# Patient Record
Sex: Male | Born: 1970
Health system: Southern US, Community
[De-identification: ages and names within clinical notes are randomized; demographics above are authoritative.]

## PROBLEM LIST (undated history)

## (undated) ENCOUNTER — Emergency Department (HOSPITAL_BASED_OUTPATIENT_CLINIC_OR_DEPARTMENT_OTHER)

---

## 2012-11-22 ENCOUNTER — Other Ambulatory Visit: Payer: Self-pay | Admitting: Gastroenterology

## 2012-11-24 ENCOUNTER — Ambulatory Visit
Admission: RE | Admit: 2012-11-24 | Discharge: 2012-11-24 | Disposition: A | Discharge status: Home / Self Care | Payer: 59 | Source: Ambulatory Visit | Attending: Gastroenterology | Admitting: Gastroenterology

## 2014-06-29 IMAGING — US US ABDOMEN COMPLETE
1 series · 14 of 25 positions shown · non-contrast
Comparison: None.

CLINICAL DATA: Elevated liver function tests.

COMPLETE ABDOMINAL ULTRASOUND

[Series 1: us abdomen complete · 0.24mm/px · 14 of 75 slices shown]
[im 1/75]
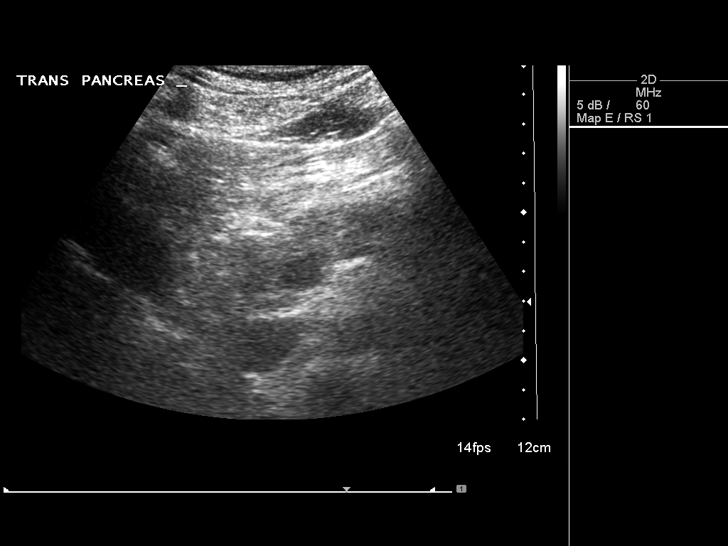
[im 7/75]
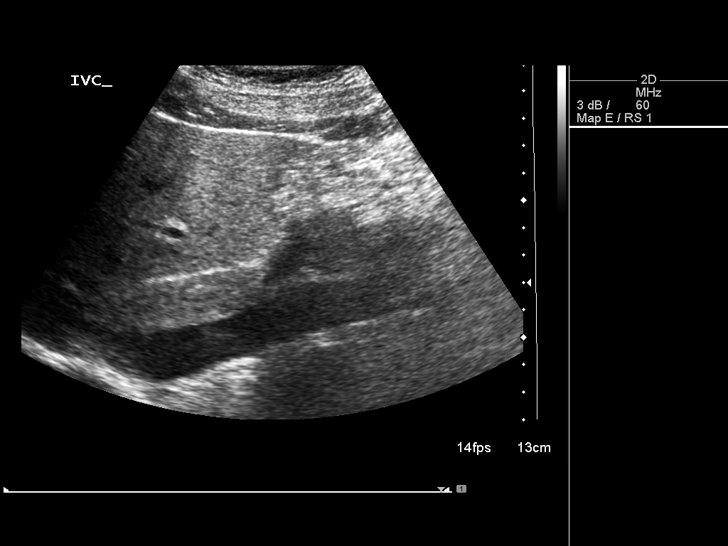
[im 13/75]
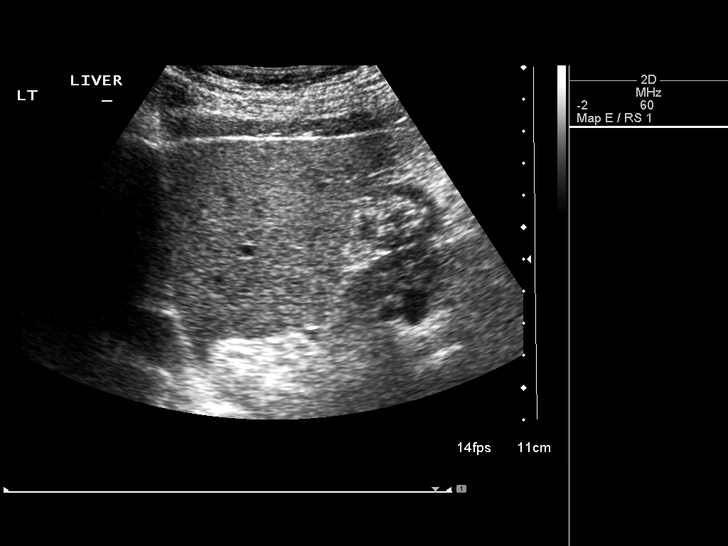
[im 19/75]
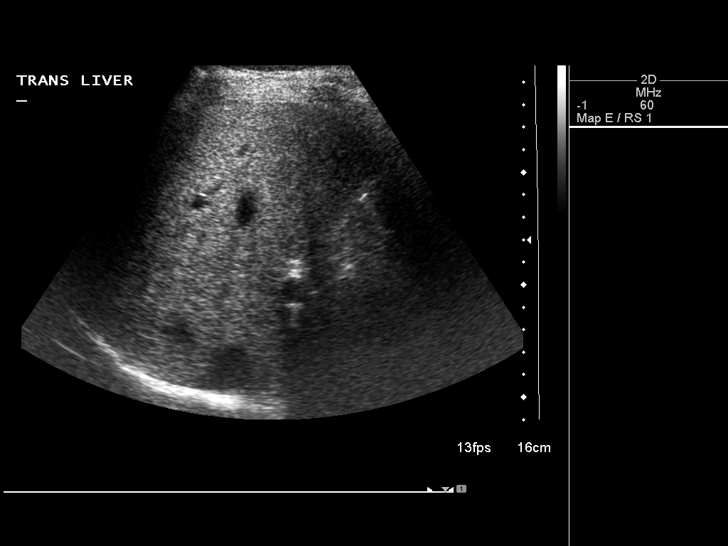
[im 25/75]
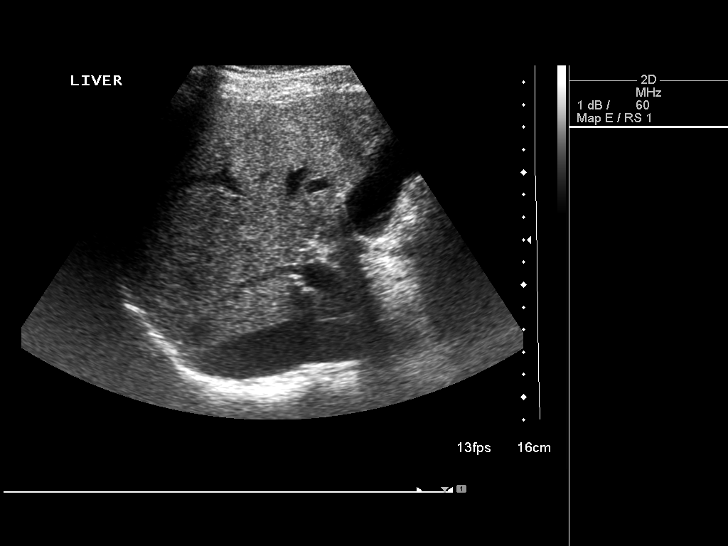
[im 28/75]
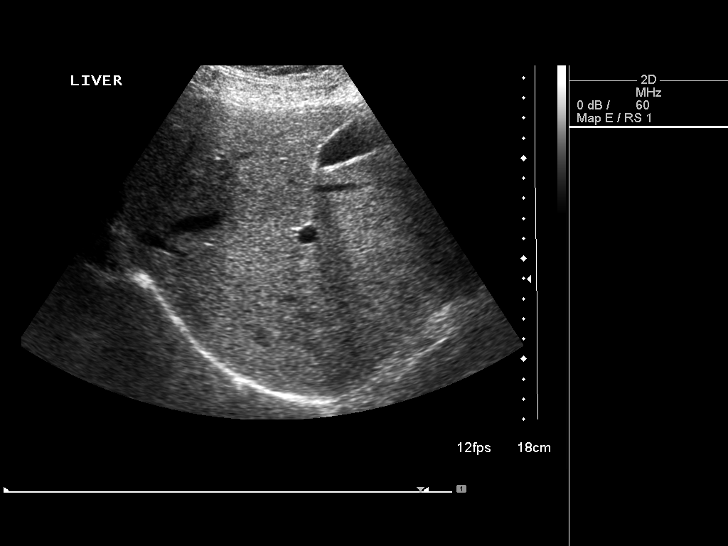
[im 34/75]
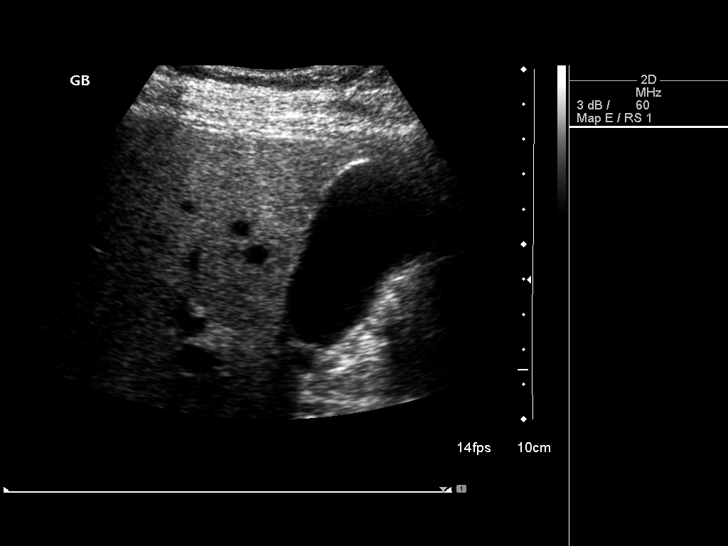
[im 41/75]
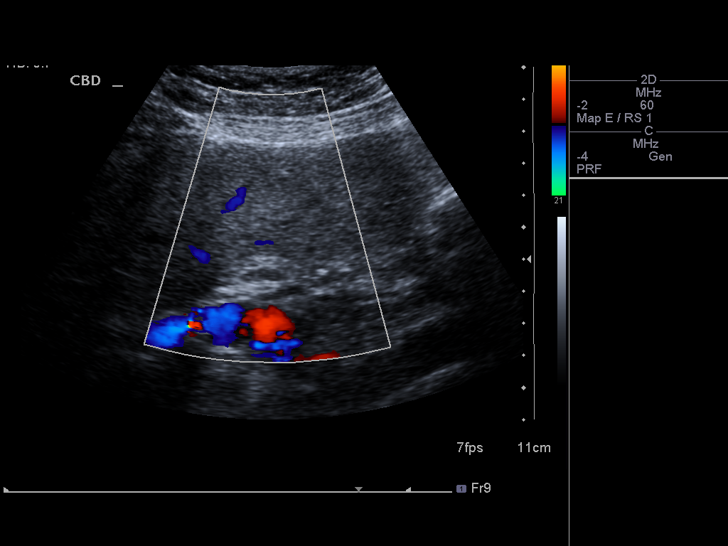
[im 47/75]
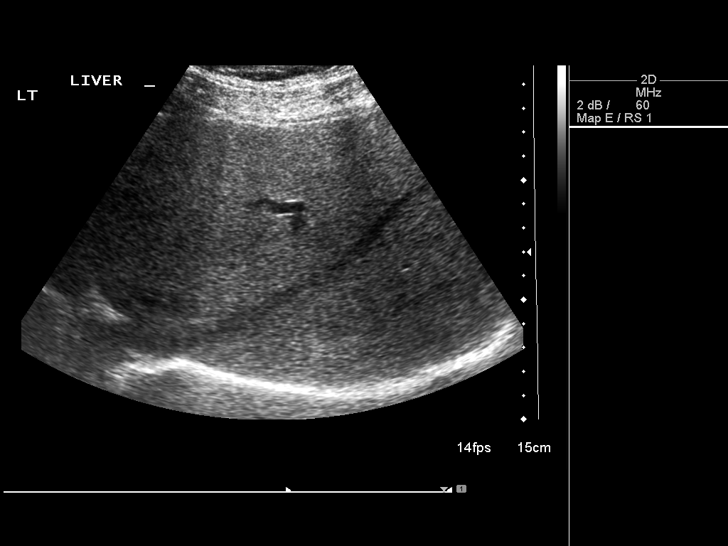
[im 50/75]
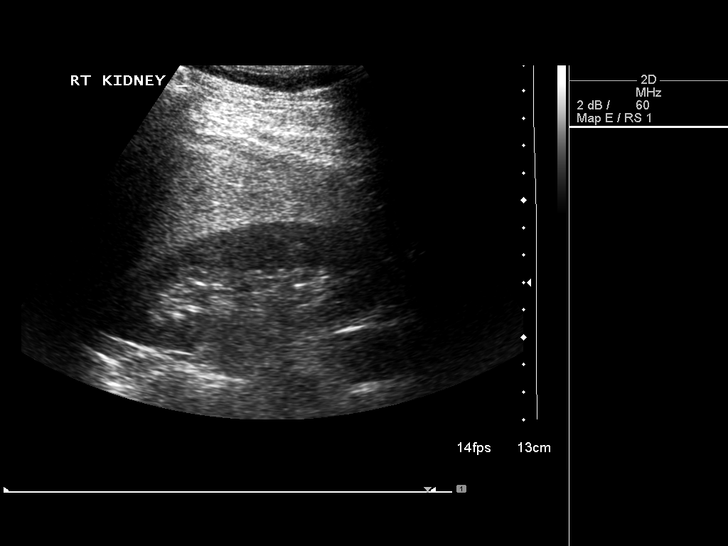
[im 56/75]
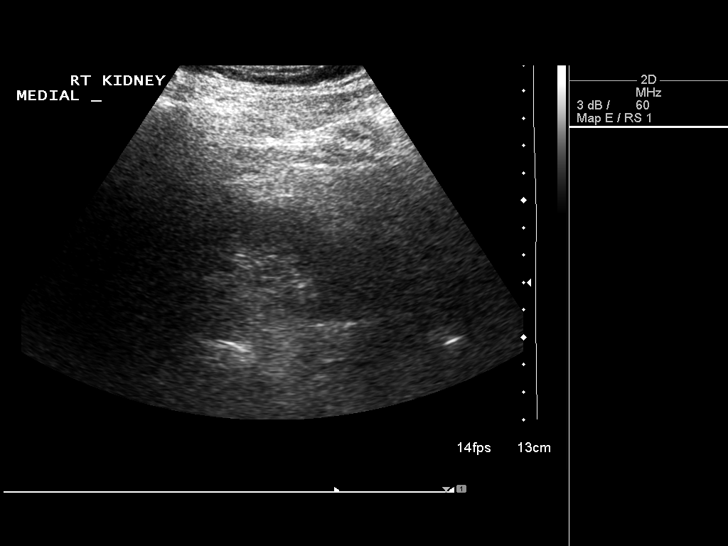
[im 62/75]
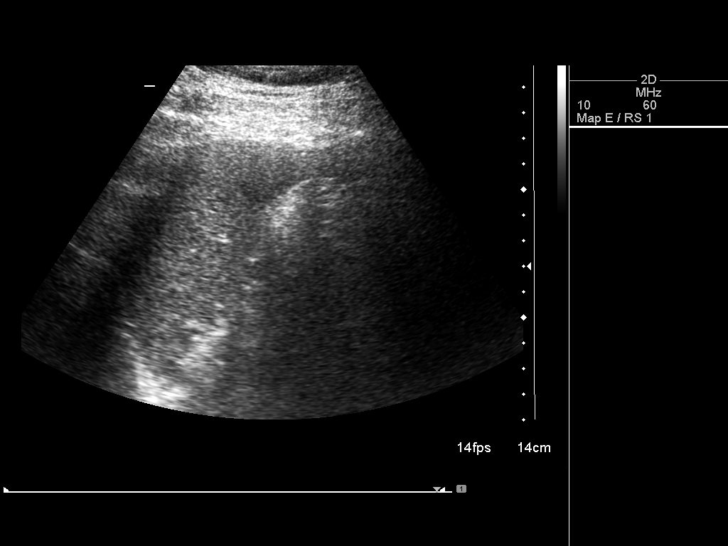
[im 68/75]
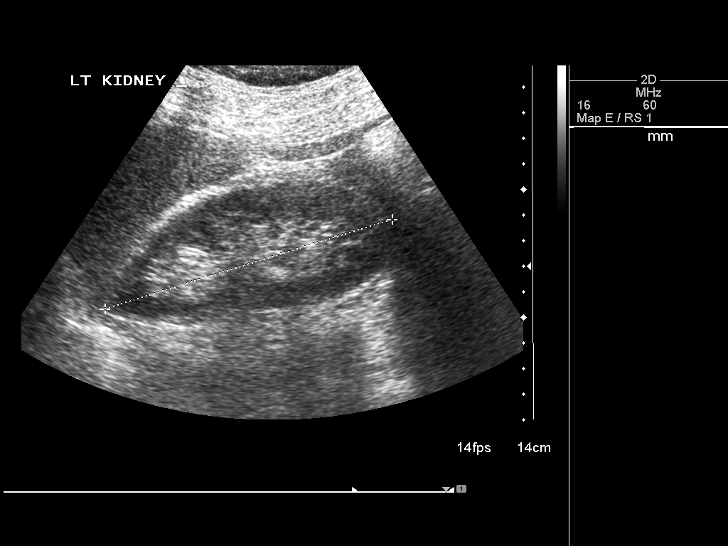
[im 75/75]
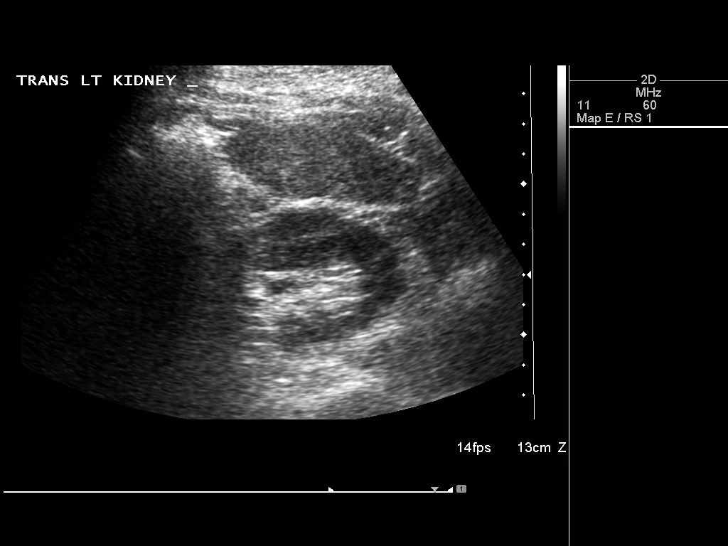

[14 of 25 positions shown; findings below may reference images not displayed]

FINDINGS: Gallbladder:  No gallstones, gallbladder wall thickening, or
pericholecystic fluid. Negative sonographic Murphy's sign.

Common bile duct:  Normal.  3 mm in diameter.

Liver:  Diffuse increased echogenicity of the liver parenchyma
consistent with hepatic steatosis.  No focal lesions.

IVC:  The mid portion is obscured by bowel gas.  Otherwise, normal.

Pancreas:  The head and body of the pancreas are normal.  A portion
of the tail of the pancreas is obscured by bowel gas.

Spleen:  Normal.  8.2 cm in length.

Right Kidney:  Normal.  11.5 cm in length.

Left Kidney:  Normal.  11.8 cm in length.

Abdominal aorta:  Normal.  2.2 cm in diameter.
IMPRESSION: Right hepatic steatosis.  Tail of the pancreas is not visible.
Otherwise, normal exam.

## 2016-12-20 DIAGNOSIS — Z0000 Encounter for general adult medical examination without abnormal findings: Secondary | ICD-10-CM | POA: Diagnosis not present

## 2016-12-27 DIAGNOSIS — G2581 Restless legs syndrome: Secondary | ICD-10-CM | POA: Diagnosis not present

## 2016-12-27 DIAGNOSIS — Z0000 Encounter for general adult medical examination without abnormal findings: Secondary | ICD-10-CM | POA: Diagnosis not present

## 2016-12-27 DIAGNOSIS — E785 Hyperlipidemia, unspecified: Secondary | ICD-10-CM | POA: Diagnosis not present

## 2017-12-29 DIAGNOSIS — R945 Abnormal results of liver function studies: Secondary | ICD-10-CM | POA: Diagnosis not present

## 2017-12-29 DIAGNOSIS — E785 Hyperlipidemia, unspecified: Secondary | ICD-10-CM | POA: Diagnosis not present

## 2017-12-29 DIAGNOSIS — Z0000 Encounter for general adult medical examination without abnormal findings: Secondary | ICD-10-CM | POA: Diagnosis not present

## 2018-01-03 DIAGNOSIS — E785 Hyperlipidemia, unspecified: Secondary | ICD-10-CM | POA: Diagnosis not present

## 2018-01-03 DIAGNOSIS — R740 Nonspecific elevation of levels of transaminase and lactic acid dehydrogenase [LDH]: Secondary | ICD-10-CM | POA: Diagnosis not present

## 2018-01-03 DIAGNOSIS — Z1212 Encounter for screening for malignant neoplasm of rectum: Secondary | ICD-10-CM | POA: Diagnosis not present

## 2018-01-03 DIAGNOSIS — J351 Hypertrophy of tonsils: Secondary | ICD-10-CM | POA: Diagnosis not present

## 2018-01-03 DIAGNOSIS — Z0000 Encounter for general adult medical examination without abnormal findings: Secondary | ICD-10-CM | POA: Diagnosis not present

## 2018-02-07 DIAGNOSIS — H10413 Chronic giant papillary conjunctivitis, bilateral: Secondary | ICD-10-CM | POA: Diagnosis not present

## 2018-03-06 DIAGNOSIS — R740 Nonspecific elevation of levels of transaminase and lactic acid dehydrogenase [LDH]: Secondary | ICD-10-CM | POA: Diagnosis not present

## 2018-03-08 DIAGNOSIS — R740 Nonspecific elevation of levels of transaminase and lactic acid dehydrogenase [LDH]: Secondary | ICD-10-CM | POA: Diagnosis not present

## 2018-03-10 ENCOUNTER — Other Ambulatory Visit: Payer: Self-pay | Admitting: Internal Medicine

## 2018-03-10 DIAGNOSIS — R740 Nonspecific elevation of levels of transaminase and lactic acid dehydrogenase [LDH]: Principal | ICD-10-CM

## 2018-03-10 DIAGNOSIS — R7401 Elevation of levels of liver transaminase levels: Secondary | ICD-10-CM

## 2018-03-24 ENCOUNTER — Ambulatory Visit
Admission: RE | Admit: 2018-03-24 | Discharge: 2018-03-24 | Disposition: A | Discharge status: Home / Self Care | Payer: 59 | Source: Ambulatory Visit | Attending: Internal Medicine | Admitting: Internal Medicine

## 2018-03-24 DIAGNOSIS — R7401 Elevation of levels of liver transaminase levels: Secondary | ICD-10-CM

## 2018-03-24 DIAGNOSIS — R7989 Other specified abnormal findings of blood chemistry: Secondary | ICD-10-CM | POA: Diagnosis not present

## 2018-03-24 DIAGNOSIS — R740 Nonspecific elevation of levels of transaminase and lactic acid dehydrogenase [LDH]: Principal | ICD-10-CM

## 2018-06-05 DIAGNOSIS — R740 Nonspecific elevation of levels of transaminase and lactic acid dehydrogenase [LDH]: Secondary | ICD-10-CM | POA: Diagnosis not present

## 2018-06-06 DIAGNOSIS — R740 Nonspecific elevation of levels of transaminase and lactic acid dehydrogenase [LDH]: Secondary | ICD-10-CM | POA: Diagnosis not present

## 2019-07-01 IMAGING — US US ABDOMEN COMPLETE
1 series · 14 of 25 positions shown · non-contrast
Comparison: No prior.

CLINICAL DATA: Elevated LFTs.

EXAM:
ABDOMEN ULTRASOUND COMPLETE

[Series 1: us abdomen complete · 0.17mm/px · 14 of 80 slices shown]
[im 1/80]
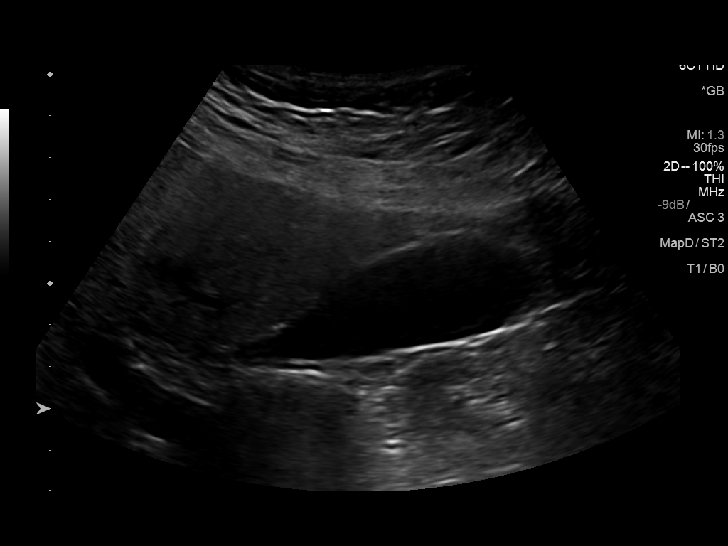
[im 7/80]
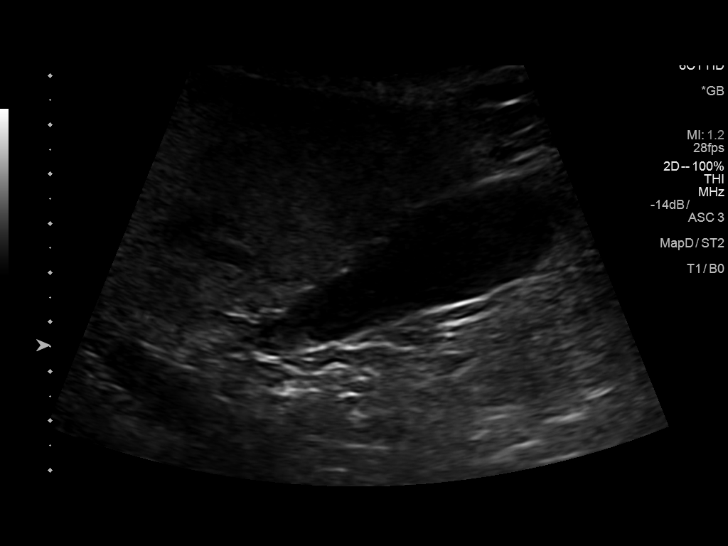
[im 14/80]
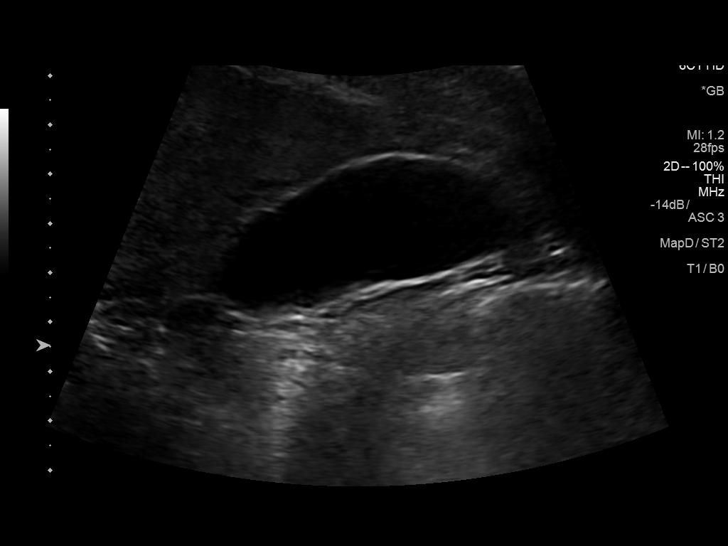
[im 20/80]
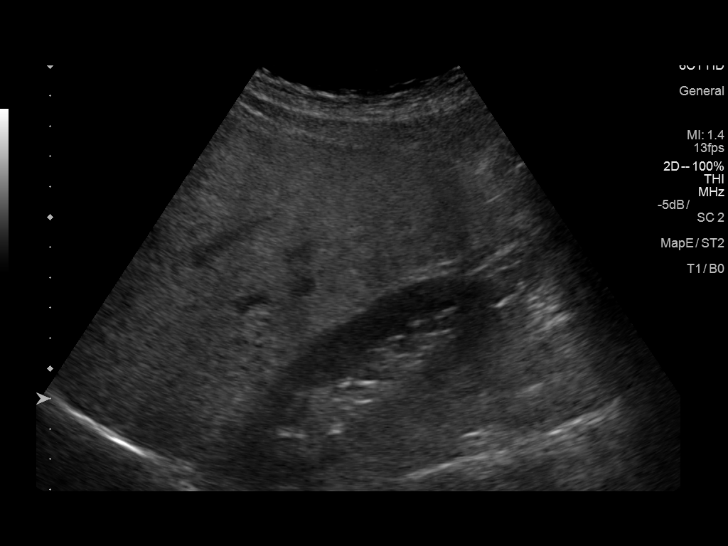
[im 27/80]
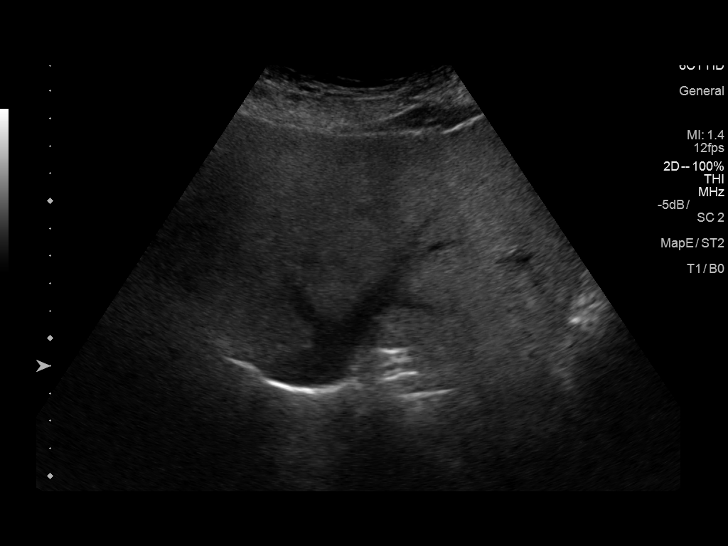
[im 30/80]
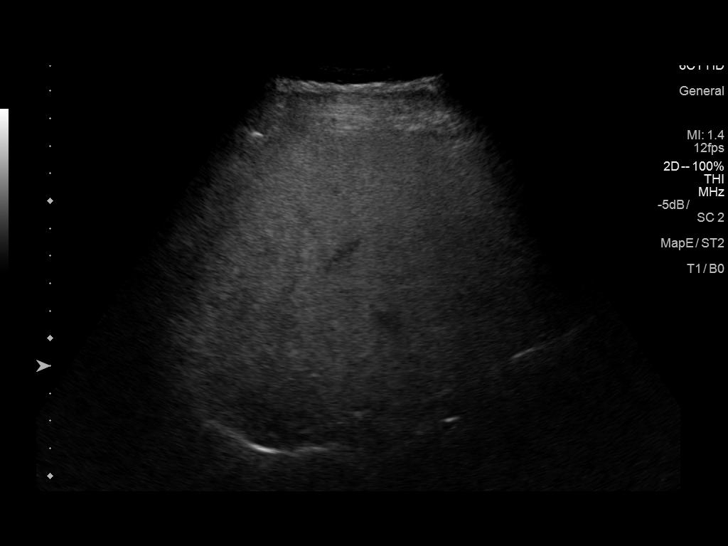
[im 37/80]
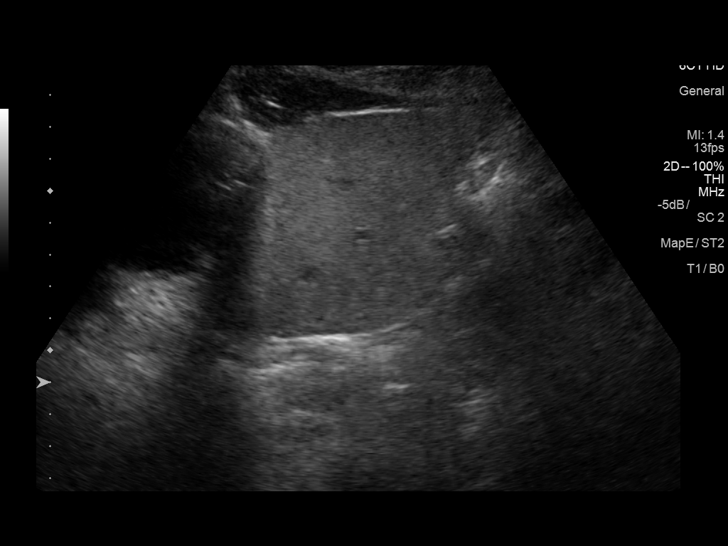
[im 43/80]
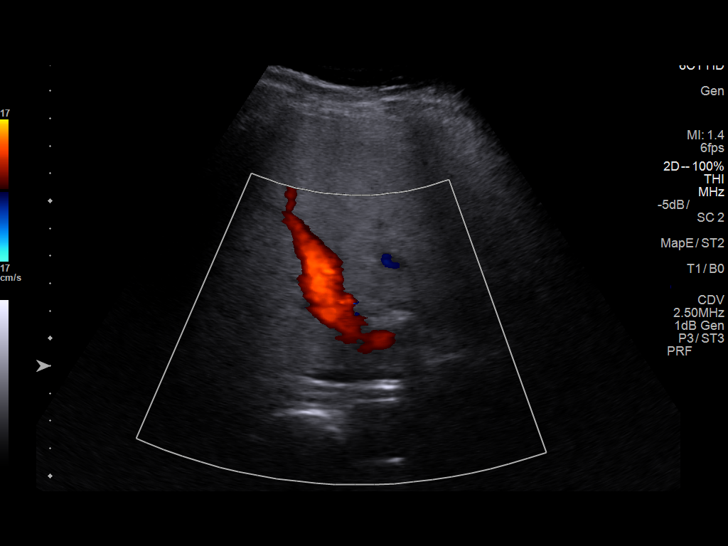
[im 50/80]
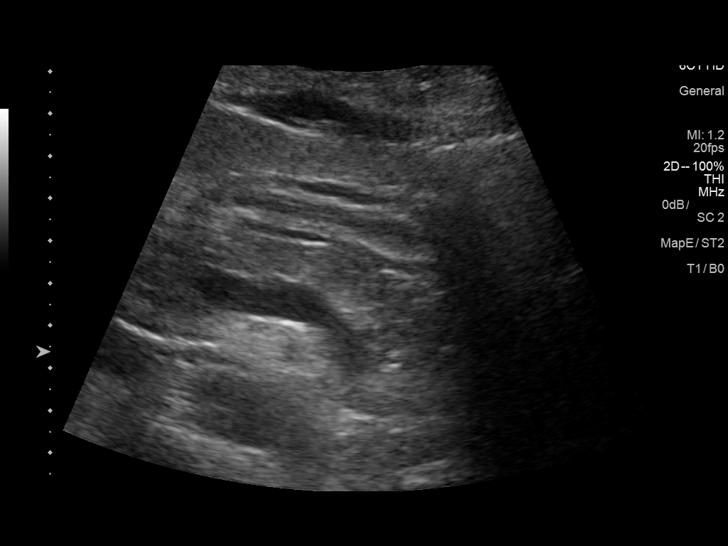
[im 53/80]
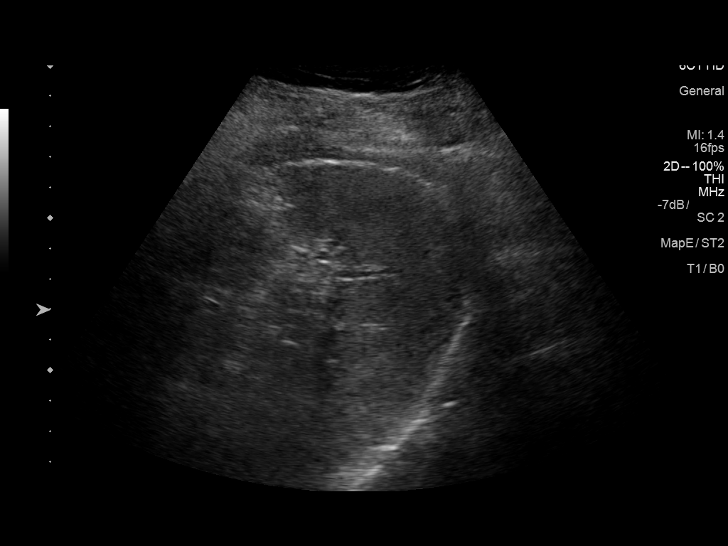
[im 60/80]
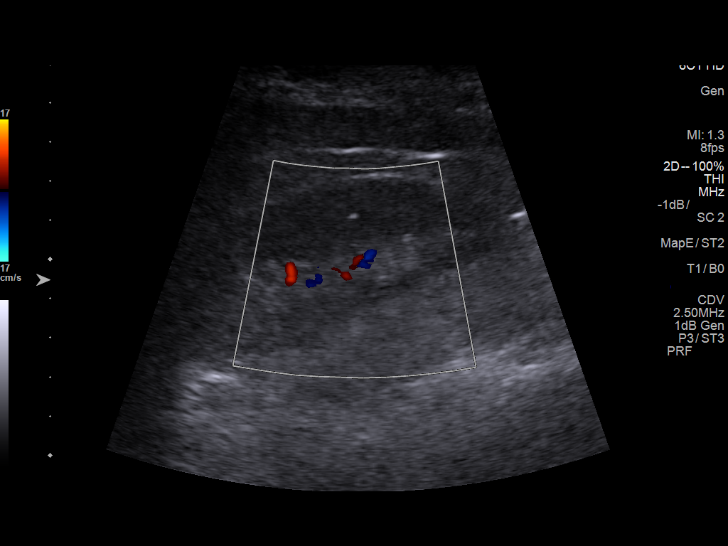
[im 66/80]
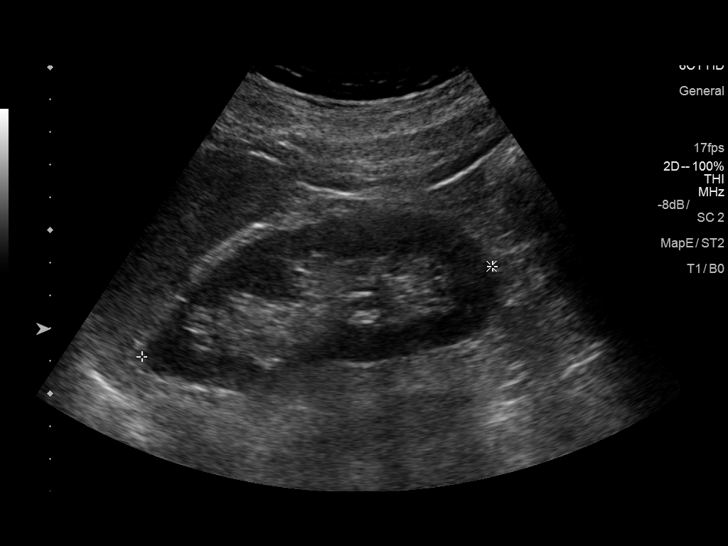
[im 73/80]
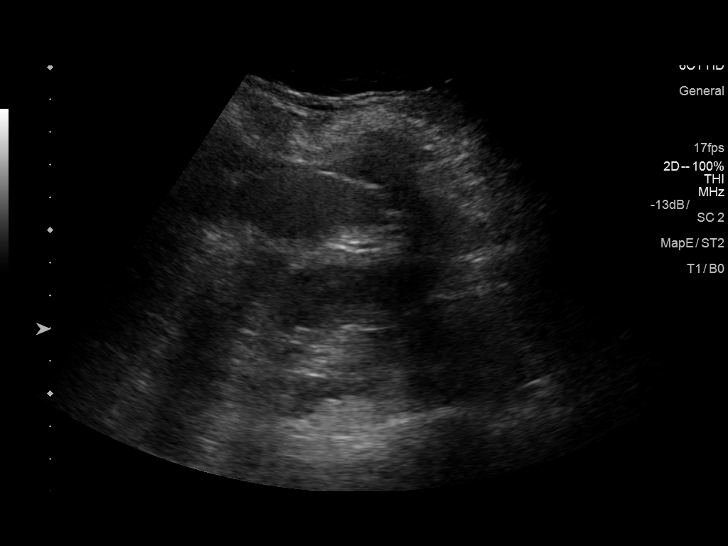
[im 80/80]
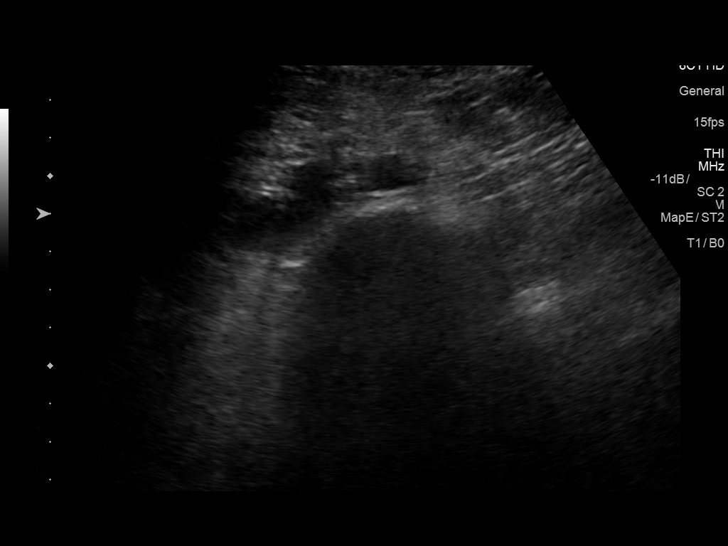

[14 of 25 positions shown; findings below may reference images not displayed]

FINDINGS: Gallbladder: No gallstones or wall thickening visualized. No
sonographic Murphy sign noted by sonographer.

Common bile duct: Diameter: 3.0 mm

Liver: Heterogeneous hepatic parenchymal pattern. Portal vein is
patent on color Doppler imaging with normal direction of blood flow
towards the liver.

IVC: No abnormality visualized.

Pancreas: Visualized portion unremarkable.

Spleen: Size and appearance within normal limits.

Right Kidney: Length: 10.7 cm. Echogenicity within normal limits. No
mass or hydronephrosis visualized.

Left Kidney: Length: 11.1 cm. Echogenicity within normal limits. No
mass or hydronephrosis visualized.

Abdominal aorta: No aneurysm visualized.

Other findings: None.
IMPRESSION: 1. Heterogeneous hepatic parenchymal pattern consistent fatty
infiltration and/or hepatocellular disease.

2. Exam otherwise unremarkable. No gallstones or biliary distention.

## 2019-08-16 ENCOUNTER — Other Ambulatory Visit: Payer: Self-pay | Admitting: Gastroenterology

## 2019-08-16 ENCOUNTER — Other Ambulatory Visit (HOSPITAL_COMMUNITY): Payer: Self-pay | Admitting: Gastroenterology

## 2019-08-16 DIAGNOSIS — K760 Fatty (change of) liver, not elsewhere classified: Secondary | ICD-10-CM

## 2019-08-22 ENCOUNTER — Ambulatory Visit (HOSPITAL_COMMUNITY)
Admission: RE | Admit: 2019-08-22 | Discharge: 2019-08-22 | Disposition: A | Discharge status: Home / Self Care | Payer: 59 | Source: Ambulatory Visit | Attending: Gastroenterology | Admitting: Gastroenterology

## 2019-08-22 ENCOUNTER — Other Ambulatory Visit: Payer: Self-pay

## 2019-08-22 DIAGNOSIS — K760 Fatty (change of) liver, not elsewhere classified: Secondary | ICD-10-CM | POA: Insufficient documentation

## 2020-08-18 ENCOUNTER — Ambulatory Visit: Payer: 59 | Attending: Internal Medicine

## 2020-08-18 DIAGNOSIS — Z23 Encounter for immunization: Secondary | ICD-10-CM

## 2020-08-18 NOTE — Progress Notes (Signed)
   Covid-19 Vaccination Clinic  Name:  Nicolas Davis    MRN: [id number] DOB: 14-Oct-1970  08/18/2020  Nicolas Davis was observed post Covid-19 immunization for 15 minutes without incident. He was provided with Vaccine Information Sheet and instruction to access the V-Safe system.   Nicolas Davis was instructed to call 911 with any severe reactions post vaccine: Marland Kitchen Difficulty breathing  . Swelling of face and throat  . A fast heartbeat  . A bad rash all over body  . Dizziness and weakness   Immunizations Administered    Name Date Dose VIS Date Route   Pfizer COVID-19 Vaccine 08/18/2020  2:39 PM 0.3 mL 06/18/2020 Intramuscular   Manufacturer: [employer], [employer]   Lot: [id number]   NDC: [id number]

## 2021-03-26 IMAGING — US US ABDOMEN LIMITED W/ ELASTOGRAPHY
2 series · 12 of 25 positions shown · non-contrast
Comparison: None.

CLINICAL DATA: Fatty liver.  Elevated liver function tests.

EXAM:
US ABDOMEN LIMITED - RIGHT UPPER QUADRANT
ULTRASOUND HEPATIC ELASTOGRAPHY
TECHNIQUE: Sonography of the right upper quadrant was performed. In addition,
ultrasound elastography evaluation of the liver was performed. A
region of interest was placed within the right lobe of the liver.
Following application of a compressive sonographic pulse, tissue
compressibility was assessed. Multiple assessments were performed at
the selected site. Median tissue compressibility was determined.
Previously, hepatic stiffness was assessed by shear wave velocity.
Based on recently published Society of Radiologists in Ultrasound
consensus article, reporting is now recommended to be performed in
the SI units of pressure (kiloPascals) representing hepatic
stiffness/elasticity. The obtained result is compared to the
published reference standards. (cACLD= compensated Advanced Chronic
Liver Disease)

[Series 1: us abdomen limited w/ elastography · 9 of 42 slices shown (1 of 2)]
[im 3/42]
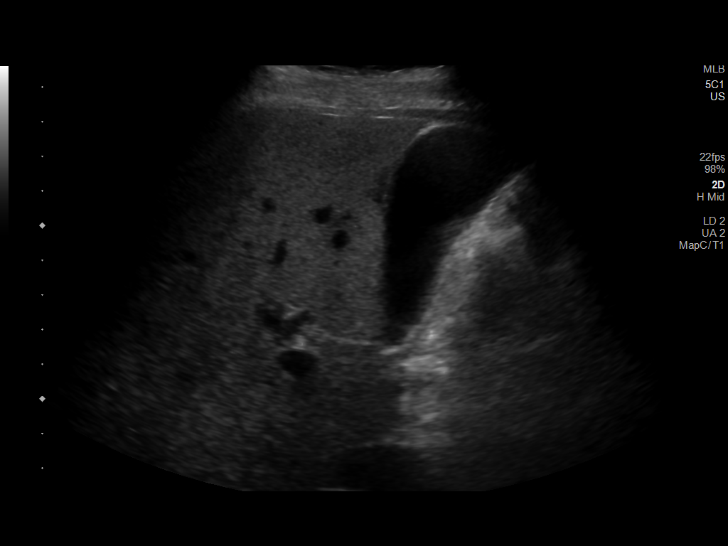
[im 7/42]
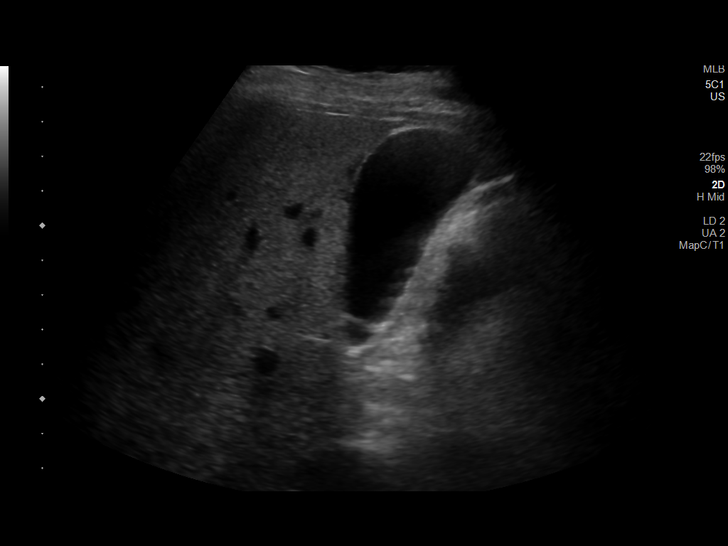
[im 12/42]
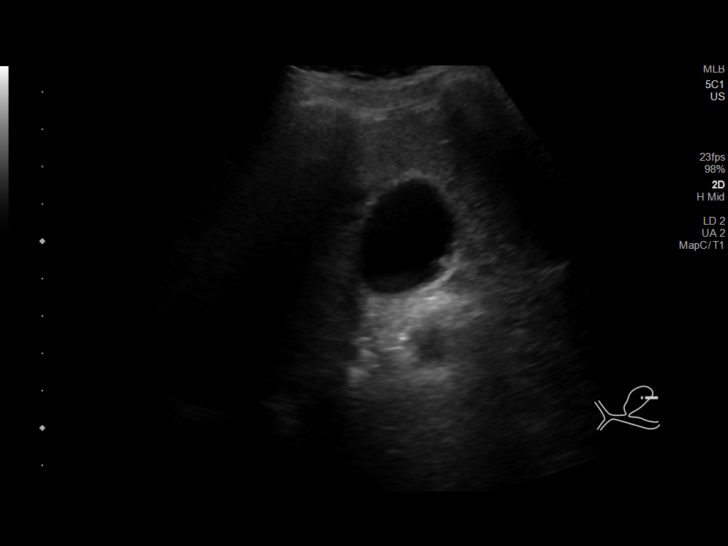
[im 16/42]
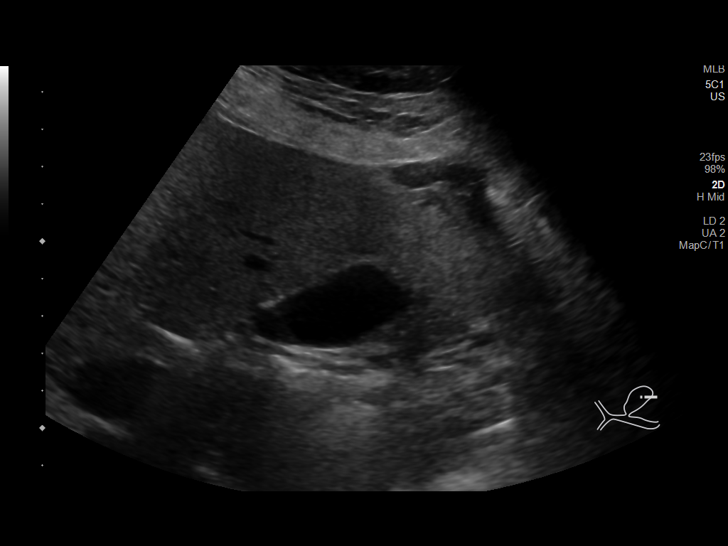
[im 21/42]
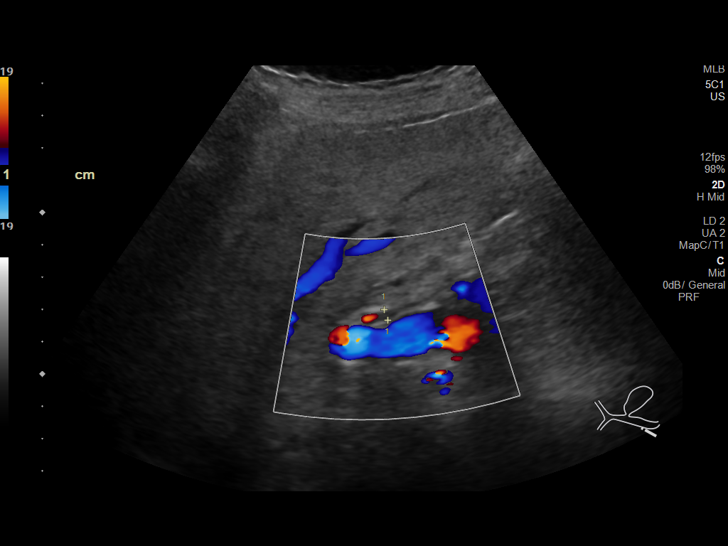
[im 26/42]
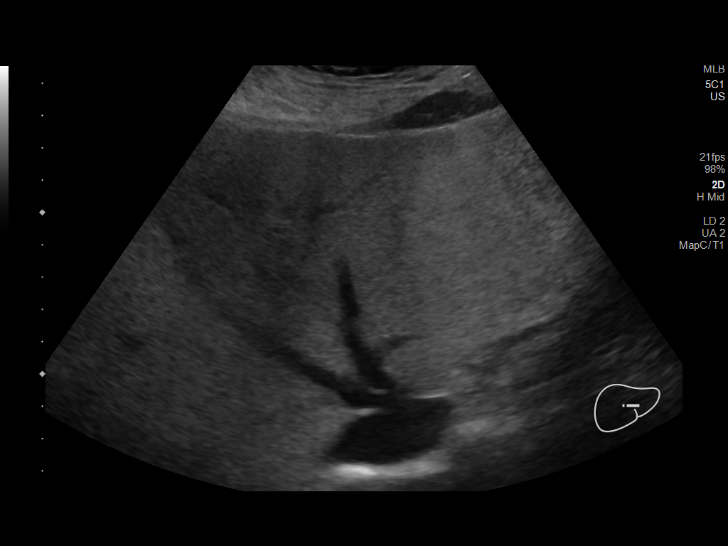
[im 30/42]
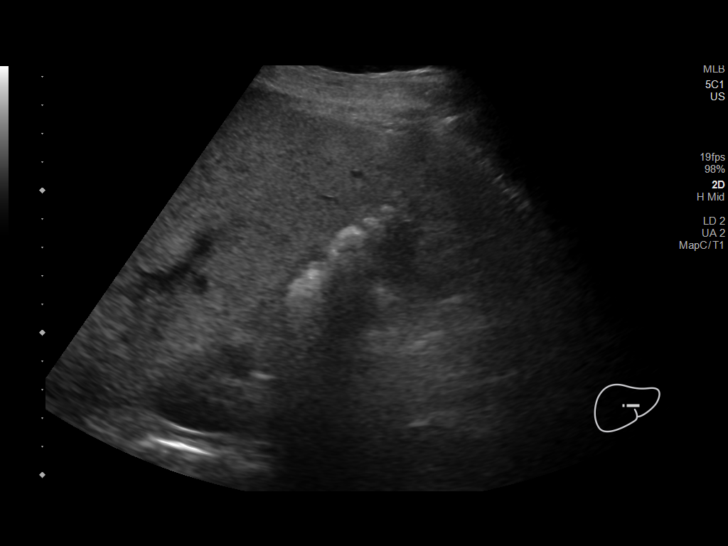
[im 35/42]
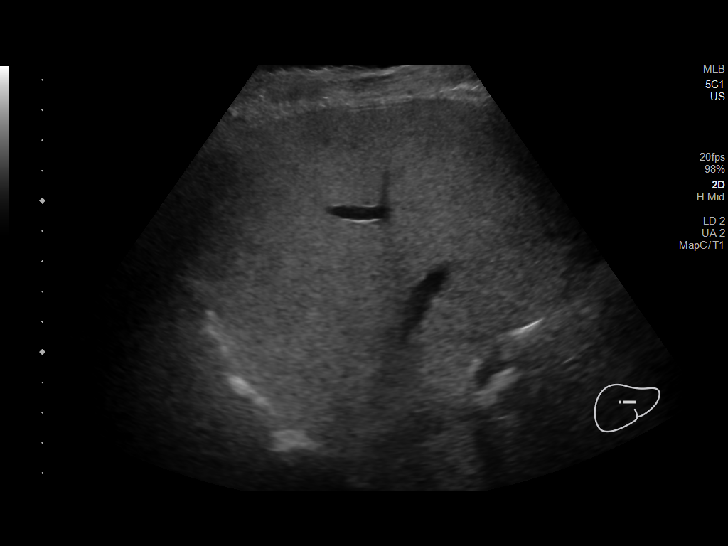
[im 39/42]
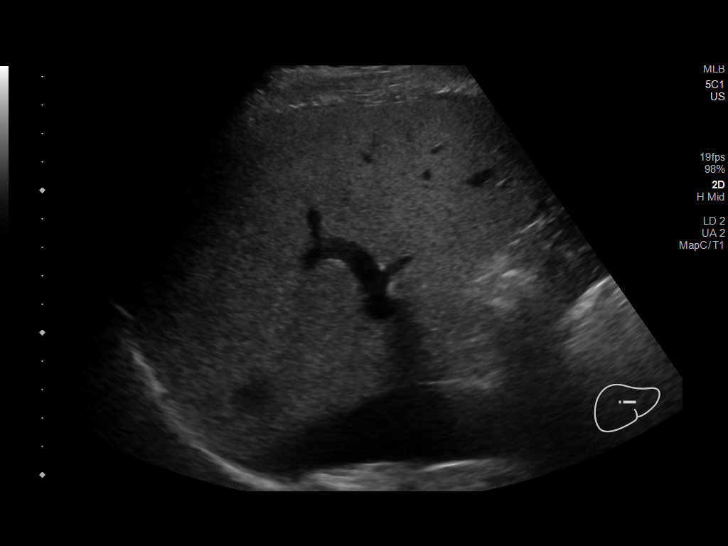

[Series 2: us abdomen limited w/ elastography · 3 of 14 slices shown (2 of 2)]
[im 1/14]
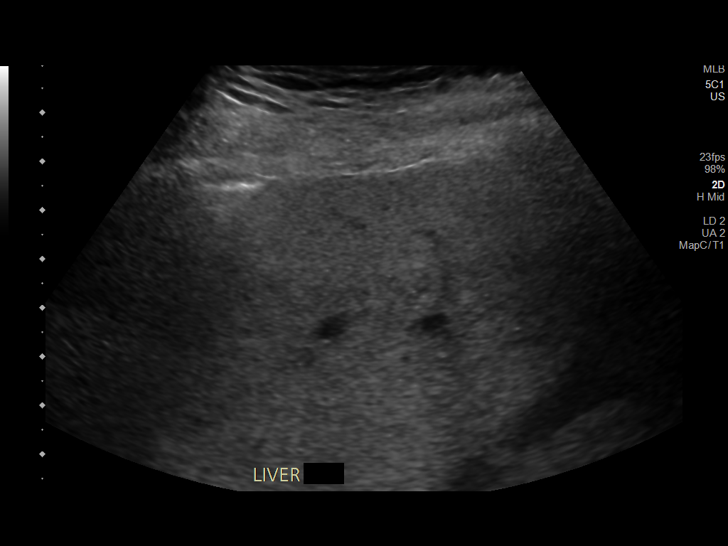
[im 6/14]
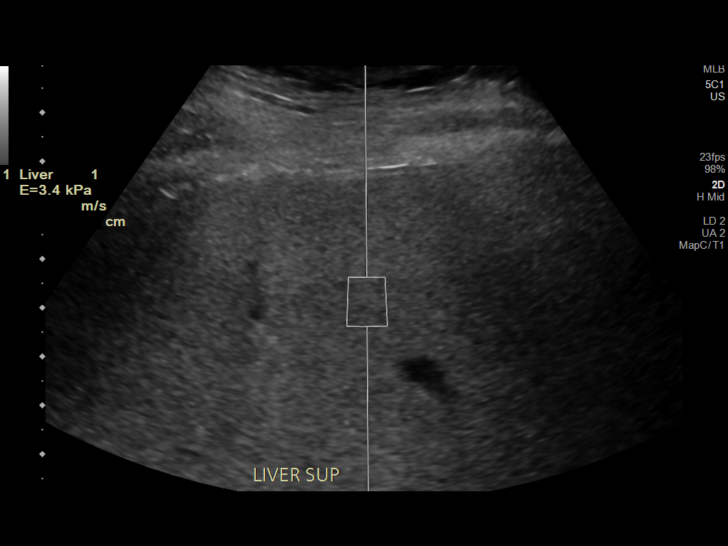
[im 11/14]
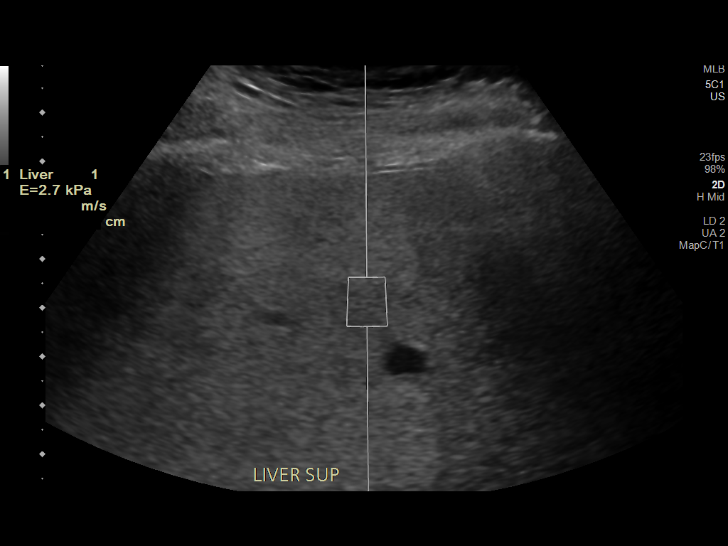

[12 of 25 positions shown; findings below may reference images not displayed]

FINDINGS: ULTRASOUND ABDOMEN LIMITED RIGHT UPPER QUADRANT

Gallbladder:

No gallstones or wall thickening visualized. A 3 mm non-mobile
gallbladder polyp is incidentally noted. No sonographic Murphy sign
noted.

Common bile duct:

Diameter: 4 mm, within normal limits.

Liver:

Diffusely increased echogenicity of the hepatic parenchyma,
consistent with hepatic steatosis. No hepatic mass identified.
Portal vein is patent on color Doppler imaging with normal direction
of blood flow towards the liver.

ULTRASOUND HEPATIC ELASTOGRAPHY

Device: Siemens Helix VTQ

Patient position: Supine

Transducer [id number]

Number of measurements: 10

Hepatic segment:  8

Median kPa:

IQR:

IQR/Median kPa ratio:

Data quality:  Good

Diagnostic category:  ?5 kPa: high probability of being normal
IMPRESSION: ULTRASOUND RUQ:

Diffuse hepatic steatosis.  No hepatic mass visualized.

No evidence of cholelithiasis or biliary ductal dilatation.

ULTRASOUND HEPATIC ELASTOGRAPHY:

Median kPa:

Diagnostic category:  ?5 kPa: high probability of being normal

The use of hepatic elastography is applicable to patients with viral
hepatitis and non-alcoholic fatty liver disease. At this time, there
is insufficient data for the referenced cut-off values and use in
other causes of liver disease, including alcoholic liver disease.
Patients, however, may be assessed by elastography and serve as
their own reference standard/baseline.

In patients with non-alcoholic liver disease, the values suggesting
compensated advanced chronic liver disease (cACLD) may be lower, and
patients may need additional testing with elasticity results of [DATE]
kPa.

Please note that abnormal hepatic elasticity and shear wave
velocities may also be identified in clinical settings other than
with hepatic fibrosis, such as: acute hepatitis, elevated right
heart and central venous pressures including use of beta blockers,
[place] disease ([place]), infiltrative processes such as
mastocytosis/amyloidosis/infiltrative tumor/lymphoma, extrahepatic
cholestasis, with hyperemia in the post-prandial state, and with
liver transplantation. Correlation with patient history, laboratory
data, and clinical condition recommended.

Diagnostic Categories:

?5 kPa: high probability of being normal

?9 kPa: in the absence of other known clinical signs, rules [DATE] kPa and ?13 kPa: suggestive of cACLD, but needs further testing

>13 kPa: highly suggestive of cACLD

?17 kPa: highly suggestive of cACLD with an increased probability of
clinically significant portal hypertension

## 2021-03-31 ENCOUNTER — Ambulatory Visit: Payer: 59 | Attending: Internal Medicine

## 2021-03-31 ENCOUNTER — Other Ambulatory Visit (HOSPITAL_BASED_OUTPATIENT_CLINIC_OR_DEPARTMENT_OTHER): Payer: Self-pay

## 2021-03-31 ENCOUNTER — Other Ambulatory Visit: Payer: Self-pay

## 2021-03-31 DIAGNOSIS — Z23 Encounter for immunization: Secondary | ICD-10-CM

## 2021-03-31 MED ORDER — PFIZER-BIONT COVID-19 VAC-TRIS 30 MCG/0.3ML IM SUSP
INTRAMUSCULAR | 0 refills | Status: DC
  Filled 2021-03-31: qty 0.3, 1d supply, fill #0

## 2021-03-31 NOTE — Progress Notes (Signed)
   Covid-19 Vaccination Clinic  Name:  Exander Shaul    MRN: [id number] DOB: Jul 21, 1971  03/31/2021  Mr. Merrick was observed post Covid-19 immunization for 15 minutes without incident. He was provided with Vaccine Information Sheet and instruction to access the V-Safe system.   Mr. Monnig was instructed to call 911 with any severe reactions post vaccine: Difficulty breathing  Swelling of face and throat  A fast heartbeat  A bad rash all over body  Dizziness and weakness   Immunizations Administered     Name Date Dose VIS Date Route   PFIZER Comrnaty(Gray TOP) Covid-19 Vaccine 03/31/2021  2:01 PM 0.3 mL 08/07/2020 Intramuscular   Manufacturer: [employer], [employer]   Lot: [id number]   NDC: [phone]

## 2022-03-22 ENCOUNTER — Encounter: Payer: Self-pay | Admitting: Physical Therapy

## 2022-03-22 ENCOUNTER — Ambulatory Visit: Payer: 59 | Attending: Internal Medicine | Admitting: Physical Therapy

## 2022-03-22 ENCOUNTER — Telehealth: Payer: Self-pay | Admitting: Physical Therapy

## 2022-03-22 DIAGNOSIS — R2681 Unsteadiness on feet: Secondary | ICD-10-CM | POA: Diagnosis not present

## 2022-03-22 DIAGNOSIS — R2689 Other abnormalities of gait and mobility: Secondary | ICD-10-CM | POA: Diagnosis present

## 2022-03-22 NOTE — Telephone Encounter (Signed)
Dr. Renne Crigler, Nicolas Davis was evaluated by PT on 03/22/2022.  The patient would benefit from an OT evaluation for oculomotor impairments and decreased visual acuity.    If you agree, please place an order in [facility] workque in [facility] or fax the order to [phone]. Thank you, Peter Congo, PT, DPT, [facility] [address] [place], [place]  [address] Phone:  [phone] Fax:  [phone]

## 2022-03-22 NOTE — Therapy (Signed)
OUTPATIENT PHYSICAL THERAPY NEURO EVALUATION   Patient Name: Nicolas Davis MRN: [id number] DOB:1971-02-18, 51 y.o., male Today's Date: 03/22/2022   PCP: Merri Brunette, MD REFERRING PROVIDER: Merri Brunette, MD    PT End of Session - 03/22/22 1334     Visit Number 1    Number of Visits 9   plus eval   Date for PT Re-Evaluation 05/03/22    Authorization Type UHC    PT Start Time 1331    PT Stop Time 1427    PT Time Calculation (min) 56 min    Activity Tolerance Patient tolerated treatment well    Behavior During Therapy [facility] for tasks assessed/performed             History reviewed. No pertinent past medical history. History reviewed. No pertinent surgical history. There are no problems to display for this patient.   ONSET DATE: 03/15/2022   REFERRING DIAG: R27.0 (ICD-10-CM) - Ataxia   THERAPY DIAG:  Unsteadiness on feet  Other abnormalities of gait and mobility  Rationale for Evaluation and Treatment Rehabilitation  SUBJECTIVE:                                                                                                                                                                                              SUBJECTIVE STATEMENT: Pt reports "I noticed something was off with my balance" and had started noticing some changes last fall. Pt reports he enjoyed running, hiking, biking; now does more walking, cardiovascular activity such as elliptical, and does strength training with exercise bands. Has modified activities to be safer due to impaired balance. He reports he has noticed impairments with his eyesight including impaired depth perception which has led to balance deficits.  Pt accompanied by: self  PERTINENT HISTORY: No significant medical history.  PAIN:  Are you having pain? No  PRECAUTIONS: Fall  WEIGHT BEARING RESTRICTIONS No  FALLS: Has patient fallen in last 6 months? 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 No N/T in hands and feet.  COORDINATION: Slight dysmetria in BUE noted with finger to nose testing and with finger chase. Rapid alt movements WFL bilaterally. Heel to shin WFL bilaterally.   POSTURE: No Significant postural limitations   LOWER EXTREMITY MMT:  seated in chair with back support  MMT Right Eval Left Eval  Hip flexion 5 5  Hip extension    Hip abduction    Hip adduction    Hip internal rotation    Hip external rotation    Knee flexion 5 5  Knee extension 5 5  Ankle dorsiflexion 5 5  Ankle plantarflexion    Ankle inversion    Ankle eversion    (Blank rows = not tested)  BED MOBILITY:  Independent  TRANSFERS: Assistive device utilized: None  Sit to stand: Complete Independence Stand to sit: Complete Independence Chair to chair: Complete Independence Floor:  not assessed  STAIRS:  Level of Assistance: Modified independence  Stair Negotiation Technique: Alternating Pattern  with Single Rail on Right  Number of Stairs: 12   Height of Stairs: 6  Comments: WFL, pt does report occasionally not taking a big enough step and catching his heel on stairs at  home  GAIT: Gait pattern: [facility] Distance walked: Various clinic distances Assistive device utilized: None Level of assistance: Complete Independence Comments: WFL  FUNCTIONAL TESTs:    [facility] PT Assessment - 03/22/22 1459       Ambulation/Gait   Gait velocity 32.8 ft/8.44 sec = 3.89 ft/sec      Standardized Balance Assessment   Standardized Balance Assessment --      Functional Gait  Assessment   Gait assessed  Yes    Gait Level Surface Walks 20 ft in less than 5.5 sec, no assistive devices, good speed, no evidence for imbalance, normal gait pattern, deviates no more than 6 in outside of the 12 in walkway width.    Change in Gait Speed Able to change speed, demonstrates mild gait deviations, deviates 6-10 in outside of the 12 in walkway width, or no gait deviations, unable to achieve a major change in velocity, or uses a change in velocity, or uses an assistive device.    Gait with Horizontal Head Turns Performs head turns smoothly with slight change in gait velocity (eg, minor disruption to smooth gait path), deviates 6-10 in outside 12 in walkway width, or uses an assistive device.    Gait with Vertical Head Turns Performs task with moderate change in gait velocity, slows down, deviates 10-15 in outside 12 in walkway width but recovers, can continue to walk.    Gait and Pivot Turn Pivot turns safely in greater than 3 sec and stops with no loss of balance, or pivot turns safely within 3 sec and stops with mild imbalance, requires small steps to catch balance.    Step Over Obstacle Is able to step over one shoe box (4.5 in total height) without changing gait speed. No evidence of imbalance.    Gait with Narrow Base of Support Ambulates 4-7 steps.    Gait with Eyes Closed Walks 20 ft, uses assistive device, slower speed, mild gait deviations, deviates 6-10 in outside 12 in walkway width. Ambulates 20 ft in less than 9 sec but greater  than 7 sec.    Ambulating Backwards Walks 20 ft, uses  assistive device, slower speed, mild gait deviations, deviates 6-10 in outside 12 in walkway width.    Steps Alternating feet, must use rail.    Total Score 19             SARA (Scale for Assessment and Rating of Ataxia) Pt scores a 7/40.  PATIENT SURVEYS:  None this session  TODAY'S TREATMENT:  None this session, PT Evaluation   PATIENT EDUCATION: Education details: Eval findings, POC Person educated: Patient Education method: Medical illustrator Education comprehension: verbalized understanding   HOME EXERCISE PROGRAM: To be established next session   GOALS: Goals reviewed with patient? Yes  SHORT TERM GOALS: Target date: 04/12/2022  Pt will perform a floor transfer at mod I level in order to demonstrate ability to safely perform fall recovery. Baseline: Goal status: INITIAL  2.  Pt will improve FGA score to 22/30 for decreased fall risk. Baseline: 19/30 on 03/22/22 Goal status: INITIAL  3.  HiMat to be assessed, STG/LTG to be written Baseline:  Goal status: INITIAL   LONG TERM GOALS: Target date: 05/03/2022  Pt will demonstrate understanding of fall risk prevention strategies to prevent falls in the home and community. Baseline:  Goal status: INITIAL  2.  Pt will improve FGA to 25/30 for decreased fall risk. Baseline: 19/30 on 03/22/22 Goal status: INITIAL  3.  Pt will be independent with HEP for improved strength, balance, transfers and gait. Baseline:  Goal status: INITIAL  4.  HiMat Goal Baseline:  Goal status: INITIAL   ASSESSMENT:  CLINICAL IMPRESSION: Patient is a 51 year old male referred to Neuro OPPT for ataxia.   Pt's has no significant PMH. The following deficits were present during the exam: slightly dysmetric movements, impaired balance, and impaired gait. Based on his FGA score of 19/30, pt is an increased risk for falls. Pt would benefit from skilled PT to address these impairments and functional limitations to maximize  functional mobility independence.  OBJECTIVE IMPAIRMENTS Abnormal gait, decreased balance, decreased coordination, and impaired vision/preception.   ACTIVITY LIMITATIONS  physical activities such as running, hiking, cycling, yard work  PARTICIPATION LIMITATIONS: community activity and yard work  PERSONAL FACTORS  N/A  are also affecting patient's functional outcome.   REHAB POTENTIAL: Good  CLINICAL DECISION MAKING: Stable/uncomplicated  EVALUATION COMPLEXITY: Low  PLAN: PT FREQUENCY: 1-2x/week  PT DURATION: 6 weeks (2x/week for 2 weeks, 1x/week for 4 weeks)  PLANNED INTERVENTIONS: Therapeutic exercises, Therapeutic activity, Neuromuscular re-education, Balance training, Gait training, Patient/Family education, Self Care, Joint mobilization, Stair training, Moist heat, Manual therapy, and Re-evaluation  PLAN FOR NEXT SESSION: Assess HiMat, initiate HEP for balance training, assess floor transfer   Peter Congo, PT, DPT, CSRS 03/22/2022, 3:55 PM

## 2022-03-30 ENCOUNTER — Encounter: Payer: Self-pay | Admitting: Physical Therapy

## 2022-03-30 ENCOUNTER — Ambulatory Visit: Payer: 59 | Attending: Internal Medicine | Admitting: Physical Therapy

## 2022-03-30 DIAGNOSIS — R278 Other lack of coordination: Secondary | ICD-10-CM | POA: Insufficient documentation

## 2022-03-30 DIAGNOSIS — R2689 Other abnormalities of gait and mobility: Secondary | ICD-10-CM | POA: Insufficient documentation

## 2022-03-30 DIAGNOSIS — R2681 Unsteadiness on feet: Secondary | ICD-10-CM | POA: Insufficient documentation

## 2022-03-30 NOTE — Therapy (Signed)
OUTPATIENT PHYSICAL THERAPY NEURO TREATMENT   Patient Name: Nicolas Davis MRN: [id number] DOB:1971-05-10, 51 y.o., male Today's Date: 03/30/2022   PCP: Deland Pretty, MD REFERRING PROVIDER: Deland Pretty, MD    PT End of Session - 03/30/22 1023     Visit Number 2    Number of Visits 9   plus eval   Date for PT Re-Evaluation 05/03/22    Authorization Type UHC    PT Start Time 1017    PT Stop Time 1105    PT Time Calculation (min) 48 min    Activity Tolerance Patient tolerated treatment well    Behavior During Therapy [facility] for tasks assessed/performed              History reviewed. No pertinent past medical history. History reviewed. No pertinent surgical history. There are no problems to display for this patient.   ONSET DATE: 03/15/2022   REFERRING DIAG: R27.0 (ICD-10-CM) - Ataxia   THERAPY DIAG:  Unsteadiness on feet  Other abnormalities of gait and mobility  Rationale for Evaluation and Treatment Rehabilitation  SUBJECTIVE:                                                                                                                                                                                              SUBJECTIVE STATEMENT: Pt reports no changes since last session, no falls, no pain. When trying to run during therapy session for [facility] assessment pt reports not feeling secure and that the "timing" of LE sequencing is off. Pt reports this is what he also experiences when trying to jump or hop. Pt reports he gave up running due to not feeling secure and that he may fall or stumble.  Pt accompanied by: self  PERTINENT HISTORY: No significant medical history.  PAIN:  Are you having pain? No  PATIENT GOALS "to maintain what I have and be able to lead as normal a life as possible and have the physical capabilities to do this as well as figure out ways to cope with this"    OBJECTIVE:   TODAY'S TREATMENT:  THER ACT: Initiated HEP, see bolded  exercises below  Trialed various balance and/or strengthening exercises to assess challenge to patient including tandem gait fwd/backward, backwards gait, monster walk fwd/back with red theraband, tall-kneeling, stance on dyna disc, rocker board, and Bosu ball in // bars. Pt reports minimal challenge with monster walks and tall-kneeling exercises. Pt reports he has been doing the tandem walking at home, provided printout HEP including tandem gait (fwd/back), backwards gait, and several static balance exercises as outlined below.  GAIT: HiMat: 23/54 Pt  exhibits difficulty with running, skipping, hopping, and bounding.  PATIENT EDUCATION: Education details: initial HEP, current balance deficits Person educated: Patient Education method: Explanation, Demonstration, and Handouts Education comprehension: verbalized understanding   HOME EXERCISE PROGRAM: Access Code: [email or web address] URL: [email or web address] Date: 03/30/2022 Prepared by: Peter Congo  Exercises - Tandem Walking with Counter Support  - 1 x daily - 7 x weekly - 1 sets - 10 reps - Backward Tandem Walking with Counter Support  - 1 x daily - 7 x weekly - 1 sets - 10 reps - Backward Walking with Counter Support  - 1 x daily - 7 x weekly - 1 sets - 10 reps - Standing on Foam Pad  - 1 x daily - 7 x weekly - 2 sets - 5 reps - 30 hold - Single Leg Balance with Eyes Closed  - 1 x daily - 7 x weekly - 2 sets - 5 reps - 30 hold   GOALS: Goals reviewed with patient? Yes  SHORT TERM GOALS: Target date: 04/12/2022  Pt will perform a floor transfer at mod I level in order to demonstrate ability to safely perform fall recovery. Baseline: Goal status: INITIAL  2.  Pt will improve FGA score to 22/30 for decreased fall risk. Baseline: 19/30 on 03/22/22 Goal status: INITIAL  3.  Pt will improve score on HiMat to 30/54 to demonstrate increased ability to perform higher level functional mobility Baseline: 23/54 (8/1) Goal  status: INITIAL   LONG TERM GOALS: Target date: 05/03/2022  Pt will demonstrate understanding of fall risk prevention strategies to prevent falls in the home and community. Baseline:  Goal status: INITIAL  2.  Pt will improve FGA to 25/30 for decreased fall risk. Baseline: 19/30 on 03/22/22 Goal status: INITIAL  3.  Pt will be independent with HEP for improved strength, balance, transfers and gait. Baseline:  Goal status: INITIAL  4.  Pt will improve score on HiMat to 35/54 to demonstrate increased ability to perform higher level functional mobility  Baseline: 23/54 (8/1) Goal status: INITIAL   ASSESSMENT:  CLINICAL IMPRESSION: Emphasis of skilled PT session on assessing higher level balance and gait impairments with administration of HiMat and initiating balance HEP. Pt scores 23/54 on the HiMat and exhibits great difficulty with tasks such as running, hopping, skipping, and being about to bound. Pt reports he feels that the "timing" of his LE are off and when he tries to take steps to run he feels that his LE can't catch up to each other. Trialed several balance and strengthening exercises this session to assess what pt finds challenging. Provided handout of initial HEP, see bolded exercises above. Pt would continue to benefit from higher level static and dynamic balance training. Continue POC.  OBJECTIVE IMPAIRMENTS Abnormal gait, decreased balance, decreased coordination, and impaired vision/preception.   ACTIVITY LIMITATIONS  physical activities such as running, hiking, cycling, yard work  PARTICIPATION LIMITATIONS: community activity and yard work  PERSONAL FACTORS  N/A  are also affecting patient's functional outcome.   REHAB POTENTIAL: Good  CLINICAL DECISION MAKING: Stable/uncomplicated  EVALUATION COMPLEXITY: Low  PLAN: PT FREQUENCY: 1-2x/week  PT DURATION: 6 weeks (2x/week for 2 weeks, 1x/week for 4 weeks)  PLANNED INTERVENTIONS: Therapeutic exercises,  Therapeutic activity, Neuromuscular re-education, Balance training, Gait training, Patient/Family education, Self Care, Joint mobilization, Stair training, Moist heat, Manual therapy, and Re-evaluation  PLAN FOR NEXT SESSION:  add to HEP for balance training, assess floor transfer, work on balance in // bars with Bosu ball/rocker board/dyna  disc adding in EC or head turns, stairs with decreased UE support, SLS, treadmill?    Peter Congo, PT, DPT, CSRS 03/30/2022, 11:15 AM

## 2022-04-02 ENCOUNTER — Ambulatory Visit: Payer: 59 | Admitting: Physical Therapy

## 2022-04-02 DIAGNOSIS — R2681 Unsteadiness on feet: Secondary | ICD-10-CM | POA: Diagnosis not present

## 2022-04-02 DIAGNOSIS — R2689 Other abnormalities of gait and mobility: Secondary | ICD-10-CM

## 2022-04-02 NOTE — Therapy (Signed)
OUTPATIENT PHYSICAL THERAPY NEURO TREATMENT   Patient Name: Nicolas Davis MRN: [id number] DOB:Dec 23, 1970, 51 y.o., male Today's Date: 04/02/2022   PCP: Merri Brunette, MD REFERRING PROVIDER: Merri Brunette, MD    PT End of Session - 04/02/22 0845     Visit Number 3    Number of Visits 9   plus eval   Date for PT Re-Evaluation 05/03/22    Authorization Type U[employer]    PT Start Time 0845    PT Stop Time 0927    PT Time Calculation (min) 42 min    Activity Tolerance Patient tolerated treatment well    Behavior During Therapy [facility] for tasks assessed/performed               No past medical history on file. No past surgical history on file. There are no problems to display for this patient.   ONSET DATE: 03/15/2022   REFERRING DIAG: R27.0 (ICD-10-CM) - Ataxia   THERAPY DIAG:  Unsteadiness on feet  Other abnormalities of gait and mobility  Rationale for Evaluation and Treatment Rehabilitation  SUBJECTIVE:                                                                                                                                                                                              SUBJECTIVE STATEMENT: Pt reports after a busy day at work yesterday he walked his dogs, did the elliptical, and also did his HEP so he is feeling it today and maybe thinks he overdid it. Usually does HEP in AM and afternoon not in the evening like he did yesterday. Feels that backwards walking is not challenging but other exercises remain a challenge. Pt also reports that the new medication he is taking is supposed to help relax the nerves around his eyes and hopefully help with some visual impairments he has been experiencing.  Pt accompanied by: self  PERTINENT HISTORY: No significant medical history.  PAIN:  Are you having pain? No  PATIENT GOALS "to maintain what I have and be able to lead as normal a life as possible and have the physical capabilities to do this as well as  figure out ways to cope with this"    OBJECTIVE:   TODAY'S TREATMENT:  THER ACT: In // bars with no UE support:  Static stance on rocker board (fwd/back) -vertical head turns (difficulty with looking up, loses balance backwards) -horizontal head turns no LOB -EC: loses balance forwards and backwards  Static stance on rocker board (R/L) -vertical head turns (LOB when looking up) -horizontal head turns no LOB -EC: loses balance more to the L  Performed 2kg weighted ball throw against rebounder: -normal stance with no LOB -Romberg stance with no LOB -normal stance on airex with no LOB -Romberg stance on airex with no LOB -SLS with CGA to min A needed due to frequent LOB, 2 x 5 reps on each LE  Updated HEP, see below   PATIENT EDUCATION: Education details: updated HEP, current balance deficits and reliance on vision for maintaining balance Person educated: Patient Education method: Explanation, Demonstration, and Handouts Education comprehension: verbalized understanding   HOME EXERCISE PROGRAM: Access Code: [email or web address] URL: [email or web address] Date: 03/30/2022 Prepared by: Peter Congo  Exercises - Tandem Walking with Counter Support  - 1 x daily - 7 x weekly - 1 sets - 10 reps - Backward Tandem Walking with Counter Support  - 1 x daily - 7 x weekly - 1 sets - 10 reps - Backward Walking with Counter Support  - 1 x daily - 7 x weekly - 1 sets - 10 reps - Standing on Foam Pad  - 1 x daily - 7 x weekly - 2 sets - 5 reps - 30 hold - Single Leg Balance with Eyes Closed  - 1 x daily - 7 x weekly - 2 sets - 5 reps - 30 hold - Stance Jacks (Modified Jumping Jacks)  - 1 x daily - 7 x weekly - 3 sets - 10 reps   GOALS: Goals reviewed with patient? Yes  SHORT TERM GOALS: Target date: 04/12/2022  Pt will perform a floor transfer at mod I level in order to demonstrate ability to safely perform fall recovery. Baseline: Goal status: INITIAL  2.  Pt will  improve FGA score to 22/30 for decreased fall risk. Baseline: 19/30 on 03/22/22 Goal status: INITIAL  3.  Pt will improve score on HiMat to 30/54 to demonstrate increased ability to perform higher level functional mobility Baseline: 23/54 (8/1) Goal status: INITIAL   LONG TERM GOALS: Target date: 05/03/2022  Pt will demonstrate understanding of fall risk prevention strategies to prevent falls in the home and community. Baseline:  Goal status: INITIAL  2.  Pt will improve FGA to 25/30 for decreased fall risk. Baseline: 19/30 on 03/22/22 Goal status: INITIAL  3.  Pt will be independent with HEP for improved strength, balance, transfers and gait. Baseline:  Goal status: INITIAL  4.  Pt will improve score on HiMat to 35/54 to demonstrate increased ability to perform higher level functional mobility  Baseline: 23/54 (8/1) Goal status: INITIAL   ASSESSMENT:  CLINICAL IMPRESSION: Emphasis of skilled PT session on assessing challenge of HEP and adding/removing exercises as appropriate. See bolded addition above, removed backwards walking as pt no longer found this challenging. Discussed purpose of PT session to come up with exercise program that pt does find challenging and that he can continue to work on at home after d/c to slow progression of his diagnosis. Pt understanding of education. Pt will continue to benefit from skilled therapy services to address balance and coordination deficits. Continue POC.  Pt's performance on rocker board this date indicates heavy reliance on his vision to maintain balance. He also continues to have some difficulty with SLS.  OBJECTIVE IMPAIRMENTS Abnormal gait, decreased balance, decreased coordination, and impaired vision/preception.   ACTIVITY LIMITATIONS  physical activities such as running, hiking, cycling, yard work  PARTICIPATION LIMITATIONS: community activity and yard work  PERSONAL FACTORS  N/A  are also affecting patient's functional outcome.    REHAB POTENTIAL: Good  CLINICAL DECISION MAKING: Stable/uncomplicated  EVALUATION  COMPLEXITY: Low  PLAN: PT FREQUENCY: 1-2x/week  PT DURATION: 6 weeks (2x/week for 2 weeks, 1x/week for 4 weeks)  PLANNED INTERVENTIONS: Therapeutic exercises, Therapeutic activity, Neuromuscular re-education, Balance training, Gait training, Patient/Family education, Self Care, Joint mobilization, Stair training, Moist heat, Manual therapy, and Re-evaluation  PLAN FOR NEXT SESSION:  add to HEP for balance training, assess floor transfer, work on balance in // bars with Bosu ball/rocker board/dyna disc adding in EC or head turns, stairs with decreased UE support, SLS, treadmill?, somatosensory training, quick vision screen    Peter Congo, PT, DPT, CSRS 04/02/2022, 11:25 AM

## 2022-04-06 ENCOUNTER — Encounter: Payer: Self-pay | Admitting: Physical Therapy

## 2022-04-06 ENCOUNTER — Ambulatory Visit: Payer: 59 | Admitting: Physical Therapy

## 2022-04-06 DIAGNOSIS — R2689 Other abnormalities of gait and mobility: Secondary | ICD-10-CM

## 2022-04-06 DIAGNOSIS — R2681 Unsteadiness on feet: Secondary | ICD-10-CM

## 2022-04-06 NOTE — Therapy (Signed)
OUTPATIENT PHYSICAL THERAPY NEURO TREATMENT   Patient Name: Nicolas Davis MRN: [id number] DOB:October 21, 1970, 51 y.o., male Today's Date: 04/06/2022   PCP: Merri Brunette, MD REFERRING PROVIDER: Merri Brunette, MD    PT End of Session - 04/06/22 1022     Visit Number 4    Number of Visits 9   plus eval   Date for PT Re-Evaluation 05/03/22    Authorization Type UHC    PT Start Time 1020    PT Stop Time 1100    PT Time Calculation (min) 40 min    Activity Tolerance Patient tolerated treatment well    Behavior During Therapy [facility] for tasks assessed/performed              History reviewed. No pertinent past medical history. History reviewed. No pertinent surgical history. There are no problems to display for this patient.   ONSET DATE: 03/15/2022   REFERRING DIAG: R27.0 (ICD-10-CM) - Ataxia   THERAPY DIAG:  Unsteadiness on feet  Other abnormalities of gait and mobility  Rationale for Evaluation and Treatment Rehabilitation  SUBJECTIVE:                                                                                                                                                                                              SUBJECTIVE STATEMENT: Pt reports he feels that the "stance jacks" are a really good exercise. He feels like its a good way to work his body in safe manner. He feels that his movements have become smoother and more coordinated though his movements are still delayed at times. Pt reports no pain this date, no falls.  Pt accompanied by: self  PERTINENT HISTORY: No significant medical history.  PAIN:  Are you having pain? No  PATIENT GOALS "to maintain what I have and be able to lead as normal a life as possible and have the physical capabilities to do this as well as figure out ways to cope with this"    OBJECTIVE:   TODAY'S TREATMENT:  THER ACT: Performed quick vision screen with patient: -H test appears normal -Lateral gaze hold: exhibits  nystagmus in direction of hold (pt reports having the most difficulty when scanning peripherally and reports it takes him increased time to focus with quick movements, also reports the new medication he has started is supposed to ease these symptoms) -Saccades appears normal  GAIT: Treadmill training Forwards gait x 5 min at 2.0 mph, addition of ball kicks for increased step length (already has increased stride length), no difficulty and no overt gait deviations  Reverse gait x 5 min at 1.5 mph while  dual-tasking (naming foods in ABC order), drifted to the R during gait with cues needed at times for safety and awareness of deviations  Lateral gait to the R up to 1.2 mph with increased difficulty maintaining balance and even step length  Lateral gait to the L up to 1.3 mph (decreased difficulty this direction), while naming animals in ABC order  With addition of dual-task cognitive task pt exhibits decreased ability to attend to his LE during gait and exhibits increased deviations and increased difficulty maintaining balance   PATIENT EDUCATION: Education details: updated HEP, current balance deficits and reliance on vision for maintaining balance Person educated: Patient Education method: Explanation, Demonstration, and Handouts Education comprehension: verbalized understanding   HOME EXERCISE PROGRAM: Access Code: [email or web address] URL: [email or web address] Date: 03/30/2022 Prepared by: Peter Congo  Exercises - Tandem Walking with Counter Support  - 1 x daily - 7 x weekly - 1 sets - 10 reps - Backward Tandem Walking with Counter Support  - 1 x daily - 7 x weekly - 1 sets - 10 reps - Backward Walking with Counter Support  - 1 x daily - 7 x weekly - 1 sets - 10 reps - Standing on Foam Pad  - 1 x daily - 7 x weekly - 2 sets - 5 reps - 30 hold - Single Leg Balance with Eyes Closed  - 1 x daily - 7 x weekly - 2 sets - 5 reps - 30 hold - Stance Jacks (Modified Jumping Jacks)  -  1 x daily - 7 x weekly - 3 sets - 10 reps - Karaoke walking (crossing over in front and behind) - 1 x daily - 7 x weekly - 1 sets - 10 reps (unable to find picture for this exercise but pt verbalizes understanding)   GOALS: Goals reviewed with patient? Yes  SHORT TERM GOALS: Target date: 04/12/2022  Pt will perform a floor transfer at mod I level in order to demonstrate ability to safely perform fall recovery. Baseline: Goal status: INITIAL  2.  Pt will improve FGA score to 22/30 for decreased fall risk. Baseline: 19/30 on 03/22/22 Goal status: INITIAL  3.  Pt will improve score on HiMat to 30/54 to demonstrate increased ability to perform higher level functional mobility Baseline: 23/54 (8/1) Goal status: INITIAL   LONG TERM GOALS: Target date: 05/03/2022  Pt will demonstrate understanding of fall risk prevention strategies to prevent falls in the home and community. Baseline:  Goal status: INITIAL  2.  Pt will improve FGA to 25/30 for decreased fall risk. Baseline: 19/30 on 03/22/22 Goal status: INITIAL  3.  Pt will be independent with HEP for improved strength, balance, transfers and gait. Baseline:  Goal status: INITIAL  4.  Pt will improve score on HiMat to 35/54 to demonstrate increased ability to perform higher level functional mobility  Baseline: 23/54 (8/1) Goal status: INITIAL   ASSESSMENT:  CLINICAL IMPRESSION: Emphasis of skilled PT session on assessing higher-level gait challenges with dual-tasking on treadmill. Pt exhibits increased gait deviations such as path deviation to the R and cross-over stepping in order to catch balance when engaging in dual-task activities. Pt continues to benefit from skilled therapy services to challenge him in higher level balance activities and work towards creating an HEP he can continue once he d/c from therapy services. Continue POC.   OBJECTIVE IMPAIRMENTS Abnormal gait, decreased balance, decreased coordination, and impaired  vision/preception.   ACTIVITY LIMITATIONS  physical activities such as running, hiking, cycling, yard work  PARTICIPATION LIMITATIONS: community activity and yard work  PERSONAL FACTORS  N/A  are also affecting patient's functional outcome.   REHAB POTENTIAL: Good  CLINICAL DECISION MAKING: Stable/uncomplicated  EVALUATION COMPLEXITY: Low  PLAN: PT FREQUENCY: 1-2x/week  PT DURATION: 6 weeks (2x/week for 2 weeks, 1x/week for 4 weeks)  PLANNED INTERVENTIONS: Therapeutic exercises, Therapeutic activity, Neuromuscular re-education, Balance training, Gait training, Patient/Family education, Self Care, Joint mobilization, Stair training, Moist heat, Manual therapy, and Re-evaluation  PLAN FOR NEXT SESSION:  add to HEP for balance training (pt wanting to know if he should purchase rocker board), assess floor transfer, work on balance in // bars with Bosu ball/rocker board/dyna disc adding in EC or head turns, stairs with decreased UE support, SLS, treadmill, somatosensory training, higher level dynamic balance training     Peter Congo, PT, DPT, CSRS 04/06/2022, 2:13 PM

## 2022-04-09 ENCOUNTER — Ambulatory Visit: Payer: 59 | Admitting: Physical Therapy

## 2022-04-09 DIAGNOSIS — R2681 Unsteadiness on feet: Secondary | ICD-10-CM | POA: Diagnosis not present

## 2022-04-09 DIAGNOSIS — R2689 Other abnormalities of gait and mobility: Secondary | ICD-10-CM

## 2022-04-09 DIAGNOSIS — R278 Other lack of coordination: Secondary | ICD-10-CM

## 2022-04-09 NOTE — Therapy (Signed)
OUTPATIENT PHYSICAL THERAPY NEURO TREATMENT   Patient Name: Nicolas Davis MRN: [id number] DOB:March 22, 1971, 51 y.o., male Today's Date: 04/09/2022   PCP: Deland Pretty, MD REFERRING PROVIDER: Deland Pretty, MD    PT End of Session - 04/09/22 0845     Visit Number 5    Number of Visits 9   plus eval   Date for PT Re-Evaluation 05/03/22    Authorization Type UHC    PT Start Time 0845    PT Stop Time 0929    PT Time Calculation (min) 44 min    Activity Tolerance Patient tolerated treatment well    Behavior During Therapy [facility] for tasks assessed/performed               No past medical history on file. No past surgical history on file. There are no problems to display for this patient.   ONSET DATE: 03/15/2022   REFERRING DIAG: R27.0 (ICD-10-CM) - Ataxia   THERAPY DIAG:  Unsteadiness on feet  Other abnormalities of gait and mobility  Other lack of coordination  Rationale for Evaluation and Treatment Rehabilitation  SUBJECTIVE:                                                                                                                                                                                              SUBJECTIVE STATEMENT: Pt reports that he worked late last night and is fatigued today. Exercises are going well, he has been doing  a lot of stretches due to sitting at his computer for a long period of time.   Pt accompanied by: self  PERTINENT HISTORY: No significant medical history.  PAIN:  Are you having pain? No  PATIENT GOALS "to maintain what I have and be able to lead as normal a life as possible and have the physical capabilities to do this as well as figure out ways to cope with this"    OBJECTIVE:   TODAY'S TREATMENT:  Therapeutic Activity  STG assessment   [facility] PT Assessment - 04/09/22 0849       Functional Gait  Assessment   Gait assessed  Yes    Gait Level Surface Walks 20 ft in less than 5.5 sec, no assistive devices, good  speed, no evidence for imbalance, normal gait pattern, deviates no more than 6 in outside of the 12 in walkway width.   4.94s   Change in Gait Speed Able to smoothly change walking speed without loss of balance or gait deviation. Deviate no more than 6 in outside of the 12 in walkway width.    Gait with Horizontal Head Turns Performs  head turns smoothly with no change in gait. Deviates no more than 6 in outside 12 in walkway width    Gait with Vertical Head Turns Performs head turns with no change in gait. Deviates no more than 6 in outside 12 in walkway width.    Gait and Pivot Turn Pivot turns safely in greater than 3 sec and stops with no loss of balance, or pivot turns safely within 3 sec and stops with mild imbalance, requires small steps to catch balance.    Step Over Obstacle Is able to step over 2 stacked shoe boxes taped together (9 in total height) without changing gait speed. No evidence of imbalance.    Gait with Narrow Base of Support Is able to ambulate for 10 steps heel to toe with no staggering.    Gait with Eyes Closed Walks 20 ft, no assistive devices, good speed, no evidence of imbalance, normal gait pattern, deviates no more than 6 in outside 12 in walkway width. Ambulates 20 ft in less than 7 sec.   5.93s   Ambulating Backwards Walks 20 ft, uses assistive device, slower speed, mild gait deviations, deviates 6-10 in outside 12 in walkway width.   9.62s   Steps Alternating feet, no rail.    Total Score 28    FGA comment: Low fall risk            NMR  -Lateral cone pick-up and stack drill over 30' x2 for improved BLE coordination, set-switching and agility training. Encouraged pt to perform high velocity lateral gallops for task, but pt unable to do so due to fear-avoidance behavior so regressed to side stepping. CGA throughout for safety. On second set, added cog dual-task (naming foods A-Z) for added challenge and pt tolerated well  -Soccer dribbling drills for improved single  leg stability, BLE coordination and dynamic balance: -Lateral dribbling down hallway and back x1 each direction (pt very fatigued in hips following cone drill). Noted increased difficulty dribbling w/LLE compared to RLE and gauging how hard to tap ball to control its movement  -Fwd/retro dribbling, down hall and back x1. Pt demonstrated more control of ball when going retro compared to going forward.  -Alt toe taps to ball and progressed to around the worlds, x4 each direction, for improved BLE coordination and speed. Pt unable to control movement when moving quickly, so regressed to slower taps. Pt able to control turning around ball well if he slowed down. Minor retropulsion noted when turning to L side, CGA for safety.   Added alt toe taps to HEP per pt request (not a great photo - see bolded below)     PATIENT EDUCATION: Education details: updates to HEP, benefits of HIIT Person educated: Patient Education method: Explanation, Demonstration, and Handouts Education comprehension: verbalized understanding   HOME EXERCISE PROGRAM: Access Code: [email or web address] URL: [email or web address] Date: 03/30/2022 Prepared by: Excell Seltzer  Exercises - Tandem Walking with Counter Support  - 1 x daily - 7 x weekly - 1 sets - 10 reps - Backward Tandem Walking with Counter Support  - 1 x daily - 7 x weekly - 1 sets - 10 reps - Backward Walking with Counter Support  - 1 x daily - 7 x weekly - 1 sets - 10 reps - Standing on Foam Pad  - 1 x daily - 7 x weekly - 2 sets - 5 reps - 30 hold - Single Leg Balance with Eyes Closed  - 1 x daily - 7 x weekly -  2 sets - 5 reps - 30 hold - Stance Jacks (Modified Jumping Jacks)  - 1 x daily - 7 x weekly - 3 sets - 10 reps - Karaoke walking (crossing over in front and behind) - 1 x daily - 7 x weekly - 1 sets - 10 reps (unable to find picture for this exercise but pt verbalizes understanding) - Standing Toe Taps  - 1 x daily - 7 x weekly - 3 sets - 10 reps  (told pt to perform to a step or ball for added challenge)    GOALS: Goals reviewed with patient? Yes  SHORT TERM GOALS: Target date: 04/12/2022  Pt will perform a floor transfer at mod I level in order to demonstrate ability to safely perform fall recovery. Baseline: Goal status: INITIAL  2.  Pt will improve FGA score to 22/30 for decreased fall risk. Baseline: 19/30 on 03/22/22; 28/30 on 8/11 Goal status: MET  3.  Pt will improve score on HiMat to 30/54 to demonstrate increased ability to perform higher level functional mobility Baseline: 23/54 (8/1) Goal status: INITIAL   LONG TERM GOALS: Target date: 05/03/2022  Pt will demonstrate understanding of fall risk prevention strategies to prevent falls in the home and community. Baseline:  Goal status: INITIAL  2.  Pt will improve FGA to 25/30 for decreased fall risk. Baseline: 19/30 on 03/22/22; 28/30 on 8/11 Goal status: MET  3.  Pt will be independent with HEP for improved strength, balance, transfers and gait. Baseline:  Goal status: INITIAL  4.  Pt will improve score on HiMat to 35/54 to demonstrate increased ability to perform higher level functional mobility  Baseline: 23/54 (8/1) Goal status: INITIAL   ASSESSMENT:  CLINICAL IMPRESSION: Emphasis of skilled PT session on STG assessment and agility training for improved BLE coordination and speed. Pt scored a 28/30 on FGA, indicative of low fall risk and surpassing both his short and long term goals. Remainder of goals to be assessed next session. Pt exhibits fear-avoidance behavior when it comes to increased velocity tasks and demonstrates improved stability w/added practice when performing new tasks. Continue POC.    OBJECTIVE IMPAIRMENTS Abnormal gait, decreased balance, decreased coordination, and impaired vision/preception.   ACTIVITY LIMITATIONS  physical activities such as running, hiking, cycling, yard work  PARTICIPATION LIMITATIONS: community activity and  yard work  PERSONAL FACTORS  N/A  are also affecting patient's functional outcome.   REHAB POTENTIAL: Good  CLINICAL DECISION MAKING: Stable/uncomplicated  EVALUATION COMPLEXITY: Low  PLAN: PT FREQUENCY: 1-2x/week  PT DURATION: 6 weeks (2x/week for 2 weeks, 1x/week for 4 weeks)  PLANNED INTERVENTIONS: Therapeutic exercises, Therapeutic activity, Neuromuscular re-education, Balance training, Gait training, Patient/Family education, Self Care, Joint mobilization, Stair training, Moist heat, Manual therapy, and Re-evaluation  PLAN FOR NEXT SESSION:  Finish STG assessment, add to HEP for balance training (pt wanting to know if he should purchase rocker board), work on balance in // bars with Bosu ball/rocker board/dyna disc adding in EC or head turns, stairs with decreased UE support, SLS, treadmill, somatosensory training, ladder drills, trampoline jumps, slam balls, cog dual-tasking     Judaea Burgoon E Geena Weinhold, PT, DPT 04/09/2022, 10:33 AM

## 2022-04-16 ENCOUNTER — Ambulatory Visit: Payer: 59

## 2022-04-16 DIAGNOSIS — R2681 Unsteadiness on feet: Secondary | ICD-10-CM | POA: Diagnosis not present

## 2022-04-16 DIAGNOSIS — R2689 Other abnormalities of gait and mobility: Secondary | ICD-10-CM

## 2022-04-16 DIAGNOSIS — R278 Other lack of coordination: Secondary | ICD-10-CM

## 2022-04-16 NOTE — Therapy (Signed)
OUTPATIENT PHYSICAL THERAPY NEURO TREATMENT   Patient Name: Ace Bergfeld MRN: [id number] DOB:[birth year], 51 y.o., male Today's Date: 04/16/2022   PCP: Deland Pretty, MD REFERRING PROVIDER: Deland Pretty, MD    PT End of Session - 04/16/22 [phone]     Visit Number 6    Number of Visits 9    Date for PT Re-Evaluation 05/03/22    Authorization Type [employer]    PT Start Time 0845    PT Stop Time 0930    PT Time Calculation (min) 45 min    Activity Tolerance Patient tolerated treatment well    Behavior During Therapy [facility] for tasks assessed/performed               History reviewed. No pertinent past medical history. History reviewed. No pertinent surgical history. There are no problems to display for this patient.   ONSET DATE: 03/15/2022   REFERRING DIAG: R27.0 (ICD-10-CM) - Ataxia   THERAPY DIAG:  Unsteadiness on feet  Other abnormalities of gait and mobility  Other lack of coordination  Rationale for Evaluation and Treatment Rehabilitation  SUBJECTIVE:                                                                                                                                                                                              SUBJECTIVE STATEMENT: Patient reports doing well. No falls/near falls. HEP going well.   Pt accompanied by: self  PERTINENT HISTORY: spinocerebellar ataxia  PAIN:  Are you having pain? No  PATIENT GOALS "to maintain what I have and be able to lead as normal a life as possible and have the physical capabilities to do this as well as figure out ways to cope with this"    OBJECTIVE:   TODAY'S TREATMENT:  NMR: -HiMAT: 13/54 -bird dog 2x12 B LE with 2s hold  -half kneeling B LE D2 modified PNF -> 3.3# ball  -10# slam ball squat -> squat + jump  -water weight amb perturbation-> hammer curl-> chin level to occlude vision -lateral shuffle to cone tap -> fwd/backward shuffle to cone tap    PATIENT EDUCATION: Education  details: continue HEP Person educated: Patient Education method: Explanation, Demonstration, and Handouts Education comprehension: verbalized understanding   HOME EXERCISE PROGRAM: Access Code: [email or web address] URL: [email or web address] Date: 03/30/2022 Prepared by: Excell Seltzer  Exercises - Tandem Walking with Counter Support  - 1 x daily - 7 x weekly - 1 sets - 10 reps - Backward Tandem Walking with Counter Support  - 1 x daily - 7 x weekly - 1 sets - 10 reps - Backward Walking with Counter Support  -  1 x daily - 7 x weekly - 1 sets - 10 reps - Standing on Foam Pad  - 1 x daily - 7 x weekly - 2 sets - 5 reps - 30 hold - Single Leg Balance with Eyes Closed  - 1 x daily - 7 x weekly - 2 sets - 5 reps - 30 hold - Stance Jacks (Modified Jumping Jacks)  - 1 x daily - 7 x weekly - 3 sets - 10 reps - Karaoke walking (crossing over in front and behind) - 1 x daily - 7 x weekly - 1 sets - 10 reps (unable to find picture for this exercise but pt verbalizes understanding) - Standing Toe Taps  - 1 x daily - 7 x weekly - 3 sets - 10 reps (told pt to perform to a step or ball for added challenge)    GOALS: Goals reviewed with patient? Yes  SHORT TERM GOALS: Target date: 04/12/2022  Pt will perform a floor transfer at mod I level in order to demonstrate ability to safely perform fall recovery. Baseline: ModI Goal status: MET  2.  Pt will improve FGA score to 22/30 for decreased fall risk. Baseline: 19/30 on 03/22/22; 28/30 on 8/11 Goal status: MET  3.  Pt will improve score on HiMat to 30/54 to demonstrate increased ability to perform higher level functional mobility Baseline: 23/54 (8/1); 13/54 (8/18)  Goal status: NOT MET   LONG TERM GOALS: Target date: 05/03/2022  Pt will demonstrate understanding of fall risk prevention strategies to prevent falls in the home and community. Baseline:  Goal status: INITIAL  2.  Pt will improve FGA to 25/30 for decreased fall risk. Baseline:  19/30 on 03/22/22; 28/30 on 8/11 Goal status: MET  3.  Pt will be independent with HEP for improved strength, balance, transfers and gait. Baseline: provided Goal status: INITIAL  4.  Pt will improve score on HiMat to 35/54 to demonstrate increased ability to perform higher level functional mobility  Baseline: 23/54 (8/1) Goal status: INITIAL   ASSESSMENT:  CLINICAL IMPRESSION: Patient seen for skilled PT session with emphasis on goal assessment and high level balance. He met 2/3 STG. He scored a 13/54 on the HiMAT indicating an increased risk for falling. He did previously score a 23/54. The decrease in scoring may be related to a potential progression of disease. He does demonstrate instability with poor coordination and reactionary balance. His LOB with most consistently posteriorly. Patient able to engage in bimanual PNF diagonals with trunk rotation component with minimal LOB. Continue POC.    OBJECTIVE IMPAIRMENTS Abnormal gait, decreased balance, decreased coordination, and impaired vision/preception.   ACTIVITY LIMITATIONS  physical activities such as running, hiking, cycling, yard work  PARTICIPATION LIMITATIONS: community activity and yard work  PERSONAL FACTORS  N/A  are also affecting patient's functional outcome.   REHAB POTENTIAL: Good  CLINICAL DECISION MAKING: Stable/uncomplicated  EVALUATION COMPLEXITY: Low  PLAN: PT FREQUENCY: 1-2x/week  PT DURATION: 6 weeks (2x/week for 2 weeks, 1x/week for 4 weeks)  PLANNED INTERVENTIONS: Therapeutic exercises, Therapeutic activity, Neuromuscular re-education, Balance training, Gait training, Patient/Family education, Self Care, Joint mobilization, Stair training, Moist heat, Manual therapy, and Re-evaluation  PLAN FOR NEXT SESSION:  work on balance in // bars with Bosu ball/rocker board/dyna disc adding in EC or head turns, stairs with decreased UE support, SLS, treadmill, somatosensory training, ladder drills, trampoline  jumps, slam balls, cog dual-tasking     Jennifer A Novak, PT, DPT Jennifer A Novak, PT, DPT,   CBIS  04/16/2022, 9:59 AM        

## 2022-04-23 ENCOUNTER — Telehealth: Payer: Self-pay | Admitting: Physical Therapy

## 2022-04-23 ENCOUNTER — Ambulatory Visit: Payer: 59 | Admitting: Physical Therapy

## 2022-04-23 DIAGNOSIS — R2681 Unsteadiness on feet: Secondary | ICD-10-CM | POA: Diagnosis not present

## 2022-04-23 DIAGNOSIS — R2689 Other abnormalities of gait and mobility: Secondary | ICD-10-CM

## 2022-04-23 DIAGNOSIS — R278 Other lack of coordination: Secondary | ICD-10-CM

## 2022-04-23 NOTE — Telephone Encounter (Signed)
Dr. Renne Crigler,  Mr. Stucky was evaluated by Physical Therapy on 03/22/2022.  The patient would benefit from an OT evaluation for visual impairments affecting his balance.    If you agree, please place an order in [facility]que in [facility] or fax the order to [phone].  Thank you, Peter Congo, PT, DPT, [facility] [address] [place], [place]  [address] Phone:  [phone] Fax:  [phone]

## 2022-04-23 NOTE — Therapy (Signed)
OUTPATIENT PHYSICAL THERAPY NEURO TREATMENT   Patient Name: Nicolas Davis MRN: [id number] DOB:1970-10-20, 51 y.o., male Today's Date: 04/23/2022   PCP: Deland Pretty, MD REFERRING PROVIDER: Deland Pretty, MD    PT End of Session - 04/23/22 1316     Visit Number 7    Number of Visits 9    Date for PT Re-Evaluation 05/03/22    Authorization Type UHC    PT Start Time 2035    PT Stop Time 1407    PT Time Calculation (min) 52 min    Activity Tolerance Patient tolerated treatment well    Behavior During Therapy [facility] for tasks assessed/performed                No past medical history on file. No past surgical history on file. There are no problems to display for this patient.   ONSET DATE: 03/15/2022   REFERRING DIAG: R27.0 (ICD-10-CM) - Ataxia   THERAPY DIAG:  Unsteadiness on feet  Other abnormalities of gait and mobility  Other lack of coordination  Rationale for Evaluation and Treatment Rehabilitation  SUBJECTIVE:                                                                                                                                                                                              SUBJECTIVE STATEMENT: Pt reports he has been very busy with work and distracted. He has only been doing HEP intermittently due to being busy but would prefer to be able to get back into a routine with it. Pt reports that exercises and PT have helped him to feel more comfortable with normal walking and he doesn't feel that his balance has regressed.  Pt accompanied by: self  PERTINENT HISTORY: spinocerebellar ataxia  PAIN:  Are you having pain? No  PATIENT GOALS "to maintain what I have and be able to lead as normal a life as possible and have the physical capabilities to do this as well as figure out ways to cope with this"    OBJECTIVE:   TODAY'S TREATMENT:  NMR: Encouraged pt to perform HiMat again, he declines due to not feeling comfortable with  hopping and/or running. Will assess next session if able for d/c.  Reviewed patient's HEP including foot switch/split jumps at bottom of stairs and bird dogs. Pt has been doing the foot switch at bottom of stairs holding onto rails for support, not able to increase speed at this time. Added lateral bounding and forward bounding to HEP, pt declines to perform in clinic at this time.  Bouncing on trampoline with no UE support and Supervision, no  LOB and minimal difficulty with this noted. Progression to small jumps/hops on trampoline with several LOB noted posteriorly with backwards stepping for fall recovery.  Pt able to perform small jumps on trampoline with one UE support on stair railing with no LOB. Pt reports having trouble with "timing" of LE preparation for landing, similar to difficulties noted with LE "timing" during attempts at running and hopping.  Gait x 500 ft around therapy gym utilizing bungee cord for resistance and multidirectional perturbations. Pt utilizes side-stepping strategy 90% of the time and cross-over stepping 10% of the time.   PATIENT EDUCATION: Education details: continue HEP, POC Person educated: Patient Education method: Explanation, Demonstration, and Handouts Education comprehension: verbalized understanding   HOME EXERCISE PROGRAM: Access Code: [email or web address] URL: [email or web address] Date: 03/30/2022 Prepared by: Excell Seltzer  Exercises - Tandem Walking with Counter Support  - 1 x daily - 7 x weekly - 1 sets - 10 reps - Backward Tandem Walking with Counter Support  - 1 x daily - 7 x weekly - 1 sets - 10 reps - Backward Walking with Counter Support  - 1 x daily - 7 x weekly - 1 sets - 10 reps - Standing on Foam Pad  - 1 x daily - 7 x weekly - 2 sets - 5 reps - 30 hold - Single Leg Balance with Eyes Closed  - 1 x daily - 7 x weekly - 2 sets - 5 reps - 30 hold - Stance Jacks (Modified Jumping Jacks to [employer])  - 1 x daily - 7 x weekly  - 3 sets - 10 reps - Karaoke walking (crossing over in front and behind) - 1 x daily - 7 x weekly - 1 sets - 10 reps (unable to find picture for this exercise but pt verbalizes understanding) - Standing Toe Taps  - 1 x daily - 7 x weekly - 3 sets - 10 reps (told pt to perform to a step or ball for added challenge)  - Bird Dog  - 1 x daily - 7 x weekly - 2 sets - 10 reps - 15 hold (Perform 5 reps with a 15 sec hold for stretch, x 15 reps with faster alternating movement) - Lateral Shuffles (Lateral Bounding) - 1 x daily - 7 x weekly - 3 sets - 10 reps - Skipping with High Knees and Arm Swing (Forward Bounding) - 1 x daily - 7 x weekly - 3 sets - 10 reps   GOALS: Goals reviewed with patient? Yes  SHORT TERM GOALS: Target date: 04/12/2022  Pt will perform a floor transfer at mod I level in order to demonstrate ability to safely perform fall recovery. Baseline: ModI Goal status: MET  2.  Pt will improve FGA score to 22/30 for decreased fall risk. Baseline: 19/30 on 03/22/22; 28/30 on 8/11 Goal status: MET  3.  Pt will improve score on HiMat to 30/54 to demonstrate increased ability to perform higher level functional mobility Baseline: 23/54 (8/1); 13/54 (8/18)  Goal status: NOT MET   LONG TERM GOALS: Target date: 05/03/2022  Pt will demonstrate understanding of fall risk prevention strategies to prevent falls in the home and community. Baseline:  Goal status: INITIAL  2.  Pt will improve FGA to 25/30 for decreased fall risk. Baseline: 19/30 on 03/22/22; 28/30 on 8/11 Goal status: MET  3.  Pt will be independent with HEP for improved strength, balance, transfers and gait. Baseline: provided Goal status: INITIAL  4.  Pt will improve  score on HiMat to 35/54 to demonstrate increased ability to perform higher level functional mobility  Baseline: 23/54 (8/1) Goal status: INITIAL   ASSESSMENT:  CLINICAL IMPRESSION: Patient seen for skilled PT session with emphasis on continuing to  assess higher level balance deficits and assist pt with creating an HEP to continue upon d/c from therapy sessions. Pt interested in possibly d/c from therapy next session if he feels comfortable with current HEP. Pt exhibits some ongoing fear-avoidance regarding certain higher level balance challenges including running, jumping, etc. Continue POC.    OBJECTIVE IMPAIRMENTS Abnormal gait, decreased balance, decreased coordination, and impaired vision/preception.   ACTIVITY LIMITATIONS  physical activities such as running, hiking, cycling, yard work  PARTICIPATION LIMITATIONS: community activity and yard work  PERSONAL FACTORS  N/A  are also affecting patient's functional outcome.   REHAB POTENTIAL: Good  CLINICAL DECISION MAKING: Stable/uncomplicated  EVALUATION COMPLEXITY: Low  PLAN: PT FREQUENCY: 1-2x/week  PT DURATION: 6 weeks (2x/week for 2 weeks, 1x/week for 4 weeks)  PLANNED INTERVENTIONS: Therapeutic exercises, Therapeutic activity, Neuromuscular re-education, Balance training, Gait training, Patient/Family education, Self Care, Joint mobilization, Stair training, Moist heat, Manual therapy, and Re-evaluation  PLAN FOR NEXT SESSION: reassess HEP and see if pt has any questions, d/c next session if pt agreeable, reassess HiMat if able, reassess FGA    Excell Seltzer, PT, DPT, CSRS  04/23/2022, 2:47 PM

## 2022-04-30 ENCOUNTER — Ambulatory Visit: Payer: 59 | Attending: Internal Medicine | Admitting: Physical Therapy

## 2022-04-30 DIAGNOSIS — R2681 Unsteadiness on feet: Secondary | ICD-10-CM | POA: Insufficient documentation

## 2022-04-30 DIAGNOSIS — R2689 Other abnormalities of gait and mobility: Secondary | ICD-10-CM | POA: Insufficient documentation

## 2022-04-30 DIAGNOSIS — R278 Other lack of coordination: Secondary | ICD-10-CM | POA: Insufficient documentation

## 2022-04-30 NOTE — Therapy (Signed)
OUTPATIENT PHYSICAL THERAPY NEURO TREATMENT   Patient Name: Nicolas Davis MRN: [id number] DOB:03-13-1971, 51 y.o., male Today's Date: 04/30/2022   PCP: Deland Pretty, MD REFERRING PROVIDER: Deland Pretty, MD   PHYSICAL THERAPY DISCHARGE SUMMARY  Visits from Start of Care: 8  Current functional level related to goals / functional outcomes: Mod I to Independent with all functional mobility   Remaining deficits: Unable to run/skip/hop due to fear of falling and LOB   Education / Equipment: Educated pt on HEP and when to seek out follow-up PT services if needed (in about 6 months)   Patient agrees to discharge. Patient goals were partially met. Patient is being discharged due to being pleased with the current functional level.     PT End of Session - 04/30/22 0848     Visit Number 8    Number of Visits 9    Date for PT Re-Evaluation 05/03/22    Authorization Type UHC    PT Start Time 0846    PT Stop Time 0910   d/c   PT Time Calculation (min) 24 min    Activity Tolerance Patient tolerated treatment well    Behavior During Therapy [facility] for tasks assessed/performed                 No past medical history on file. No past surgical history on file. There are no problems to display for this patient.   ONSET DATE: 03/15/2022   REFERRING DIAG: R27.0 (ICD-10-CM) - Ataxia   THERAPY DIAG:  Unsteadiness on feet  Other abnormalities of gait and mobility  Other lack of coordination  Rationale for Evaluation and Treatment Rehabilitation  SUBJECTIVE:                                                                                                                                                                                              SUBJECTIVE STATEMENT: Pt has initiated side-ways skipping as part of HEP. Pt reports he has come up with a routine for performing his HEP, starts with jumping jacks to wake up muscles and "jump-start" everything. Pt also reports he  feels the benefit from doing his HEP the rest of the day and into the next day.  Pt accompanied by: self  PERTINENT HISTORY: spinocerebellar ataxia  PAIN:  Are you having pain? No  PATIENT GOALS "to maintain what I have and be able to lead as normal a life as possible and have the physical capabilities to do this as well as figure out ways to cope with this"    OBJECTIVE:   TODAY'S TREATMENT:  THER ACT: Reassed HiMat:  21/54  Discussed d/c plan, continuation of HEP, instructed to obtain a new order for PT and schedule a follow-up in 6 months if needed.   PATIENT EDUCATION: Education details: continue HEP, follow-up with PT services in 6 months if needed Person educated: Patient Education method: Explanation, Demonstration, and Handouts Education comprehension: verbalized understanding   HOME EXERCISE PROGRAM: Access Code: [email or web address] URL: [email or web address] Date: 03/30/2022 Prepared by: Excell Seltzer  Exercises - Tandem Walking with Counter Support  - 1 x daily - 7 x weekly - 1 sets - 10 reps - Backward Tandem Walking with Counter Support  - 1 x daily - 7 x weekly - 1 sets - 10 reps - Backward Walking with Counter Support  - 1 x daily - 7 x weekly - 1 sets - 10 reps - Standing on Foam Pad  - 1 x daily - 7 x weekly - 2 sets - 5 reps - 30 hold - Single Leg Balance with Eyes Closed  - 1 x daily - 7 x weekly - 2 sets - 5 reps - 30 hold - Stance Jacks (Modified Jumping Jacks to [employer])  - 1 x daily - 7 x weekly - 3 sets - 10 reps - Karaoke walking (crossing over in front and behind) - 1 x daily - 7 x weekly - 1 sets - 10 reps (unable to find picture for this exercise but pt verbalizes understanding) - Standing Toe Taps  - 1 x daily - 7 x weekly - 3 sets - 10 reps (told pt to perform to a step or ball for added challenge)  - Bird Dog  - 1 x daily - 7 x weekly - 2 sets - 10 reps - 15 hold (Perform 5 reps with a 15 sec hold for stretch, x 15 reps with  faster alternating movement) - Lateral Shuffles (Lateral Bounding) - 1 x daily - 7 x weekly - 3 sets - 10 reps - Skipping with High Knees and Arm Swing (Forward Bounding) - 1 x daily - 7 x weekly - 3 sets - 10 reps   GOALS: Goals reviewed with patient? Yes  SHORT TERM GOALS: Target date: 04/12/2022  Pt will perform a floor transfer at mod I level in order to demonstrate ability to safely perform fall recovery. Baseline: ModI Goal status: MET  2.  Pt will improve FGA score to 22/30 for decreased fall risk. Baseline: 19/30 on 03/22/22; 28/30 on 8/11 Goal status: MET  3.  Pt will improve score on HiMat to 30/54 to demonstrate increased ability to perform higher level functional mobility Baseline: 23/54 (8/1); 13/54 (8/18)  Goal status: NOT MET   LONG TERM GOALS: Target date: 05/03/2022  Pt will demonstrate understanding of fall risk prevention strategies to prevent falls in the home and community. Baseline:  Goal status: MET  2.  Pt will improve FGA to 25/30 for decreased fall risk. Baseline: 19/30 on 03/22/22; 28/30 on 8/11 Goal status: MET  3.  Pt will be independent with HEP for improved strength, balance, transfers and gait. Baseline: provided Goal status: MET  4.  Pt will improve score on HiMat to 35/54 to demonstrate increased ability to perform higher level functional mobility  Baseline: 23/54 (8/1), 13/54 (8/18), 21/54 (9/1) Goal status: NOT MET   ASSESSMENT:  CLINICAL IMPRESSION: Emphasis of skilled PT session on reassessing LTG in preparation of d/c from PT services this session. Pt has met 3/4 LTG: he understands fall risk prevention strategies, exhibits an improved FGA  score from 19/30 on initial eval to 28/30 demonstrating decreased fall risk, is independent with his HEP, and improved his HiMat score from 13/54 on 8/18 to 21/54 but still exhibits a decreased score from the initial assessment of score and he did not meet the goal set for improvement of score. Pt does  remain self-limiting regarding attemtping to run, skip, or hop due to fear of falling and impaired timing of movements and therefore was unable to complete these items on the HiMat.  Overall patient's goal was to have an HEP he could continue to work on at home to slow the decline in his overall function and address higher level balance deficits. Pt shows good understanding of his diagnosis and progressive nature of spinocerebellar ataxia and that therapy will slow his decline in function but not prevent overall loss of function. Pt safe to d/c from therapy services at this time and will follow-up as needed. Continue POC.   OBJECTIVE IMPAIRMENTS Abnormal gait, decreased balance, decreased coordination, and impaired vision/preception.   ACTIVITY LIMITATIONS  physical activities such as running, hiking, cycling, yard work  PARTICIPATION LIMITATIONS: community activity and yard work  PERSONAL FACTORS  N/A  are also affecting patient's functional outcome.   REHAB POTENTIAL: Good  CLINICAL DECISION MAKING: Stable/uncomplicated  EVALUATION COMPLEXITY: Low  PLAN: PT FREQUENCY: 1-2x/week  PT DURATION: 6 weeks (2x/week for 2 weeks, 1x/week for 4 weeks)  PLANNED INTERVENTIONS: Therapeutic exercises, Therapeutic activity, Neuromuscular re-education, Balance training, Gait training, Patient/Family education, Self Care, Joint mobilization, Stair training, Moist heat, Manual therapy, and Re-evaluation     Excell Seltzer, PT, DPT, CSRS  04/30/2022, 9:12 AM

## 2022-05-07 ENCOUNTER — Ambulatory Visit: Payer: 59 | Admitting: Physical Therapy

## 2022-07-18 NOTE — Therapy (Deleted)
OUTPATIENT OCCUPATIONAL THERAPY NEURO EVALUATION  Patient Name: Nicolas Davis MRN: [id number] DOB:05-29-1971, 51 y.o., male Today's Date: 07/18/2022  PCP: Deland Pretty, MD REFERRING PROVIDER: Deland Pretty, MD  END OF SESSION:   No past medical history on file. No past surgical history on file. There are no problems to display for this patient.   ONSET DATE: 11/06/2021 (date of dx) - had experienced mild symptoms prior to diagnosis with more prominent symptoms in July of 2023  REFERRING DIAG: R27.0 (ICD-10-CM) - Ataxia  THERAPY DIAG:  No diagnosis found.  Rationale for Evaluation and Treatment: {HABREHAB:27488}  SUBJECTIVE:   SUBJECTIVE STATEMENT: *** Pt accompanied by: {accompnied:27141}  PERTINENT HISTORY: Per note from [place] visit with genetic counseling, "Offord is a 51 y.o. male with a history of imbalance and incoordination onset around age 32 years, and a positive family history of spinocerebellar ataxia type 6 (SCA6) in his biological brother in [place]. We last met with him for Genetic Counseling in 10/2021 after which we sent genetic testing for SCA6, results of which were positive and shared with him a few weeks ago."   PRECAUTIONS: {Therapy precautions:24002}  WEIGHT BEARING RESTRICTIONS: {Yes ***/No:24003}  PAIN:  Are you having pain? {OPRCPAIN:27236}  PRECAUTIONS: Fall   WEIGHT BEARING RESTRICTIONS No   FALLS: Has patient fallen in last 6 months? 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   PATIENT GOALS "to maintain what I have and be able to lead as normal a life as possible and have the physical capabilities  to do this as well as figure out ways to cope with this" ***  OBJECTIVE:   HAND DOMINANCE: {MISC; OT HAND [phone]}  ADLs: Overall ADLs: *** Transfers/ambulation related to ADLs: Eating: *** Grooming: *** UB Dressing: *** LB Dressing: *** Toileting: *** Bathing: *** Tub Shower transfers: *** Equipment: {equipment:25573}  IADLs: Shopping: *** Light housekeeping: *** Meal Prep: *** Community mobility: *** Medication management: *** Financial management: *** Handwriting: {OTWRITTENEXPRESSION:25361}  MOBILITY STATUS: {OTMOBILITY:25360}  POSTURE COMMENTS:  {posture:25561} Sitting balance: {sitting balance:25483}  ACTIVITY TOLERANCE: Activity tolerance: ***  FUNCTIONAL OUTCOME MEASURES: {OTFUNCTIONALMEASURES:27238}  UPPER EXTREMITY ROM:     AROM Right (eval) Left (eval)  Shoulder flexion    Shoulder abduction    Elbow flexion    Elbow extension    Wrist flexion    Wrist extension    Wrist pronation    Wrist supination    Digit Composite Flexion    Digit Composite Extension    Digit Opposition    (Blank rows = not tested) (Blank rows = not tested)  UPPER EXTREMITY MMT:     MMT Right (eval) Left (eval)  Shoulder flexion    Shoulder abduction    Elbow flexion    Elbow extension    (Blank rows = not tested)  HAND FUNCTION: {handfunction:27230}  COORDINATION: {otcoordination:27237}  SENSATION: {sensation:27233}  EDEMA: ***  MUSCLE TONE: {UETONE:25567}  COGNITION: Overall cognitive status: {cognition:24006}  VISION: Subjective report: *** Baseline vision: {OTBASELINEVISION:25363} Visual history: {OTVISUALHISTORY:25364}  VISION ASSESSMENT: {visionassessment:27231}  Patient has difficulty with following activities due to following visual impairments: ***  PERCEPTION: {Perception:25564}  PRAXIS: {Praxis:25565}  OBSERVATIONS: ***  TODAY'S TREATMENT:                                                                                                                               DATE: ***   PATIENT EDUCATION: Education details: *** Person educated: {Person educated:25204} Education method: {Education Method:25205} Education comprehension: {Education Comprehension:25206}  HOME EXERCISE PROGRAM: ***   GOALS: Goals reviewed with patient? {yes/no:20286}  SHORT TERM GOALS: Target date: ***  *** Baseline: Goal status: {GOALSTATUS:25110}  2.  *** Baseline:  Goal status: {GOALSTATUS:25110}  3.  *** Baseline:  Goal status: {GOALSTATUS:25110}  4.  *** Baseline:  Goal status: {GOALSTATUS:25110}  5.  *** Baseline:  Goal status: {GOALSTATUS:25110}  6.  *** Baseline:  Goal status: {GOALSTATUS:25110}  LONG TERM GOALS: Target date: ***  *** Baseline:  Goal status: {GOALSTATUS:25110}  2.  *** Baseline:  Goal status: {GOALSTATUS:25110}  3.  *** Baseline:  Goal status: {GOALSTATUS:25110}  4.  *** Baseline:  Goal status: {GOALSTATUS:25110}  5.  *** Baseline:  Goal status: {GOALSTATUS:25110}  6.  *** Baseline:  Goal status: {GOALSTATUS:25110}  ASSESSMENT:  CLINICAL IMPRESSION: Patient is a *** y.o. *** who was seen today for occupational therapy evaluation for ***.   PERFORMANCE DEFICITS: in functional skills including {OT physical skills:25468}, cognitive skills including {OT cognitive skills:25469}, and psychosocial skills including {OT psychosocial skills:25470}.   IMPAIRMENTS: are limiting patient from {OT performance deficits:25471}.   CO-MORBIDITIES: {Comorbidities:25485} that affects occupational performance. Patient will benefit from skilled OT to address above impairments and improve overall function.  MODIFICATION OR ASSISTANCE TO COMPLETE EVALUATION: {OT modification:25474}  OT OCCUPATIONAL PROFILE AND HISTORY: {OT PROFILE AND HISTORY:25484}  CLINICAL DECISION MAKING: {OT CDM:25475}  REHAB POTENTIAL: {rehabpotential:25112}  EVALUATION COMPLEXITY: {Evaluation  complexity:25115}    PLAN:  OT FREQUENCY: {rehab frequency:25116}  OT DURATION: {rehab duration:25117}  PLANNED INTERVENTIONS: {OT Interventions:25467}  RECOMMENDED OTHER SERVICES: ***  CONSULTED AND AGREED WITH PLAN OF CARE: {CWU:88916}  PLAN FOR NEXT SESSION: Dennis Bast, OT 07/18/2022, 7:46 PM

## 2022-07-19 ENCOUNTER — Ambulatory Visit: Payer: 59 | Admitting: Occupational Therapy

## 2022-07-29 ENCOUNTER — Encounter: Payer: Self-pay | Admitting: Occupational Therapy

## 2022-07-29 ENCOUNTER — Ambulatory Visit: Payer: 59 | Attending: Internal Medicine | Admitting: Occupational Therapy

## 2022-07-29 DIAGNOSIS — R41842 Visuospatial deficit: Secondary | ICD-10-CM | POA: Insufficient documentation

## 2022-07-29 DIAGNOSIS — R2681 Unsteadiness on feet: Secondary | ICD-10-CM | POA: Diagnosis present

## 2022-07-29 NOTE — Therapy (Signed)
OUTPATIENT OCCUPATIONAL THERAPY NEURO EVALUATION  Patient Name: Nicolas Davis MRN: [id number] DOB:June 19, 1971, 51 y.o., male Today's Date: 07/29/2022  PCP: Merri Brunette, MD REFERRING PROVIDER: Merri Brunette, MD  END OF SESSION:  OT End of Session - 07/29/22 1340     Visit Number 1    Number of Visits 6    Date for OT Re-Evaluation 09/27/22    Authorization Type [employer] - 40VL    OT Start Time 1230    OT Stop Time 1313    OT Time Calculation (min) 43 min    Activity Tolerance Patient tolerated treatment well    Behavior During Therapy [facility] for tasks assessed/performed             History reviewed. No pertinent past medical history. History reviewed. No pertinent surgical history. There are no problems to display for this patient.   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  Specifically referenced pencil push ups.   Pt accompanied by: self  PERTINENT HISTORY: Patient has been working with team at [facility] to address problems related to SCA 6.  No other pertinent medical history  PRECAUTIONS: Fall   WEIGHT BEARING RESTRICTIONS: No  PAIN:  Are you having pain? No  FALLS: Has patient fallen in last 6 months? No   PLOF: Independent and Independent with basic ADLs - works full time as an Art gallery manager for [employer]  PATIENT GOALS: Prevent deterioration of eye motor control  OBJECTIVE:     ADLs: Overall ADLs: Independent in all aspects of ADL/IADL.  Driving, working, travelling   MOBILITY STATUS: Independent  POSTURE COMMENTS:  No Significant postural limitations   ACTIVITY TOLERANCE: Activity tolerance: No deficits   UPPER EXTREMITY  ROM:  WFL     COGNITION: Overall cognitive status: Within functional limits for tasks assessed  VISION: Subjective report: Reports dyscoordination and extra eye shifts - noe observed this session Baseline vision: Wears glasses for distance only Visual history:  Myopia, now with ataxia impacting eye coordination/ balance  VISION ASSESSMENT: Eye alignment: WFL Tracking/Visual pursuits: Able to track stimulus in all quads without difficulty Visual Fields: no apparent deficits  Patient has difficulty with following activities due to following visual impairments: None noted  PERCEPTION: WFL  PRAXIS: WFL  OBSERVATIONS: Patient here for eye exercises.  No functional deficits noted from eye motor dyscoordination.  Recommended further assessment either at [facility] ataxia program, or gave resource of neuro optometry locally.  Patient meets with Neurology Friday to determine logical next steps.     TODAY'S TREATMENT:  DATE: 07/29/22  Patient denies any functional manifestation from eye motor problems.  He does wear glasses for distance vision and reports one recent incident in which he was watching a regatta and had symptoms (felt queasy) from looking at moving boats on water and looking down while standing still with reflection in glasses.  When he removed glasses symptoms resolved.  Patient denies any difficulty relating to computer screen use, reading, driving, motion sensitivity - head eye movement, etc.  Patient has made some subtle adjustments to his behavior - eg not running at night to off set balance issues.  Discussed at length that patient may be better served by eye specialist who can do very specific eye muscle training to prevent functional impact of ataxia in eyes.  Patient in agreement and will plan for 4-5 potential visits beyond eval as advised by  neurology.    PATIENT EDUCATION: Education details: resources for vision therapy - Actuary Person educated: Patient Education method: Explanation Education comprehension: verbalized understanding  HOME EXERCISE PROGRAM: Not established   GOALS: Goals reviewed with patient? Yes  SHORT TERM GOALS: Target date: 08/27/22  Patient will complete eye movement exercises with minimal cueing/correction Goal status: INITIAL  LONG TERM GOALS: Target date: 09/27/21  Patient will demonstrate proficiency with upgraded eye movement exercises  Goal status: INITIAL   ASSESSMENT:  CLINICAL IMPRESSION: Patient is a 51 y.o. 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   PERFORMANCE DEFICITS: in functional skills including coordination and balance,    IMPAIRMENTS: are limiting patient from  NA at this time, patient attempting to prevent functional decline .   CO-MORBIDITIES: has no other co-morbidities that affects occupational performance. Patient will benefit from skilled OT to address above impairments and improve overall function.  MODIFICATION OR ASSISTANCE TO COMPLETE EVALUATION: No modification of tasks or assist necessary to complete an evaluation.  OT OCCUPATIONAL PROFILE AND HISTORY: Problem focused assessment: Including review of records relating to presenting problem.  CLINICAL DECISION MAKING: LOW - limited treatment options, no task modification necessary  REHAB POTENTIAL: Excellent  EVALUATION COMPLEXITY: Low    PLAN:  OT FREQUENCY:  5 visits over 8 weeks not to exceed 1x/week  OT DURATION: 8 weeks  PLANNED INTERVENTIONS: therapeutic exercise, therapeutic activity, and visual/perceptual remediation/compensation  RECOMMENDED OTHER SERVICES: neuro-optometry  CONSULTED AND AGREED WITH PLAN OF CARE: Patient  PLAN FOR NEXT SESSION: Patient to bring in results of recent optometric exam, and  discuss recommendations of neurologist.  Assess depth perception, saccades, pursuits.  Begin specific HEP for visual coordination   Collier Salina, OT 07/29/2022, 1:41 PM

## 2024-08-03 ENCOUNTER — Other Ambulatory Visit: Payer: Self-pay

## 2024-08-03 ENCOUNTER — Emergency Department (HOSPITAL_BASED_OUTPATIENT_CLINIC_OR_DEPARTMENT_OTHER)

## 2024-08-03 ENCOUNTER — Emergency Department (HOSPITAL_BASED_OUTPATIENT_CLINIC_OR_DEPARTMENT_OTHER)
Admission: EM | Admit: 2024-08-03 | Discharge: 2024-08-03 | Disposition: A | Discharge status: Home / Self Care | Attending: Emergency Medicine | Admitting: Emergency Medicine

## 2024-08-03 DIAGNOSIS — S61231A Puncture wound without foreign body of left index finger without damage to nail, initial encounter: Secondary | ICD-10-CM | POA: Insufficient documentation

## 2024-08-03 DIAGNOSIS — S61031A Puncture wound without foreign body of right thumb without damage to nail, initial encounter: Secondary | ICD-10-CM | POA: Insufficient documentation

## 2024-08-03 DIAGNOSIS — Z2914 Encounter for prophylactic rabies immune globin: Secondary | ICD-10-CM | POA: Insufficient documentation

## 2024-08-03 DIAGNOSIS — W540XXA Bitten by dog, initial encounter: Secondary | ICD-10-CM | POA: Insufficient documentation

## 2024-08-03 DIAGNOSIS — Z203 Contact with and (suspected) exposure to rabies: Secondary | ICD-10-CM | POA: Insufficient documentation

## 2024-08-03 MED ORDER — AMOXICILLIN-POT CLAVULANATE 875-125 MG PO TABS
1.0000 | ORAL_TABLET | Freq: Once | ORAL | Status: AC
  Administered 2024-08-03: 1 via ORAL
  Filled 2024-08-03: qty 1

## 2024-08-03 MED ORDER — RABIES IMMUNE GLOBULIN 300 UNIT/2ML IJ SOLN
20.0000 [IU]/kg | Freq: Once | INTRAMUSCULAR | Status: AC
  Administered 2024-08-03: 1500 [IU] via INTRAMUSCULAR
  Filled 2024-08-03: qty 10

## 2024-08-03 MED ORDER — AMOXICILLIN-POT CLAVULANATE 875-125 MG PO TABS: 1.0000 | ORAL_TABLET | Freq: Two times a day (BID) | ORAL | 0 refills | Status: AC

## 2024-08-03 MED ORDER — RABIES VIRUS VACCINE, HDC IM SUSR
1.0000 mL | Freq: Once | INTRAMUSCULAR | Status: AC
  Administered 2024-08-03: 1 mL via INTRAMUSCULAR
  Filled 2024-08-03: qty 1

## 2024-08-03 NOTE — ED Notes (Signed)
 Pt states that he doesn't want xrays

## 2024-08-03 NOTE — Discharge Instructions (Addendum)
 Follow-up outpatient for your remaining rabies series.  Check with your primary care provider on your tetanus status.  I have written for Augmentin  which is the antibiotic you will take.                                   RABIES VACCINE FOLLOW UP  Patient's Name: Nicolas Davis                     Original Order Date:08/03/2024  Medical Record Number: [id number]  ED Physician: Tonia Chew, MD Primary Diagnosis: Rabies Exposure       PCP: Clarice Nottingham, MD  Patient Phone Number: (home) [phone] (home)    (cell)  Telephone Information:  Mobile [phone]    (work) [phone] (work) Species of Animal: Dog   You have been seen in the Emergency Department for a possible rabies exposure. It's very important you return for the additional vaccine doses.  Please call the clinic listed below for hours of operation.   Clinic that will administer your rabies vaccines: [facility] - [address], [place], [place] [address]  [phone]  DAY 0:  08/03/2024      DAY 3:  08/06/2024       DAY 7:  08/10/2024     DAY 14:  08/17/2024         The 5th vaccine injection is considered for immune compromised patients only.  DAY 28:  08/31/2024

## 2024-08-03 NOTE — ED Triage Notes (Addendum)
 Pt POV after breaking up fight between his dog and another dog last night. Other dog's owner never provided proof of vaccination. Puncture wounds to L index finger and R thumb, tetanus within last 10 years.

## 2024-08-03 NOTE — ED Provider Notes (Signed)
 [place] EMERGENCY DEPARTMENT AT [facility] Provider Note   CSN: [id number] Arrival date & time: 08/03/24  2018     Patient presents with: Animal Bite   Nicolas Davis is a 53 y.o. male here for evaluation of dog bite.  Occurred yesterday.  Was breaking up a dog that was attacking his dog.  He suffered a puncture wound to his left index finger and right thumb.  He thinks his tetanus is up-to-date however not sure.  He did not originally come as animal control was involved and the other party was supposed to surrender their dog to be monitored for rabies however they did not show.  He cleaned his wounds initially.  He denies any bony tenderness, numbness or weakness or drainage to the wound.  His dog is up-to-date on vaccines.   HPI     Prior to Admission medications   Medication Sig Start Date End Date Taking? Authorizing Provider  amoxicillin -clavulanate (AUGMENTIN ) 875-125 MG tablet Take 1 tablet by mouth every 12 (twelve) hours. 08/03/24  Yes Darianny Momon A, PA-C  COVID-19 mRNA Vac-TriS, Pfizer, (PFIZER-BIONT COVID-19 VAC-TRIS) SUSP injection Inject into the muscle. 03/31/21   Luiz Channel, MD  Dalfampridine (4-AMINOPYRIDINE) POWD Take 10 mg by mouth 2 (two) times daily.    [provider]    Allergies: Patient has no allergy information on record.    Review of Systems  Constitutional: Negative.   Cardiovascular: Negative.   Gastrointestinal: Negative.   Genitourinary: Negative.   Musculoskeletal: Negative.   Skin: Negative.   Neurological: Negative.   All other systems reviewed and are negative.   Updated Vital Signs BP (!) 109/93 (BP Location: Right Arm)   Pulse 99   Temp 98.1 F (36.7 C)   Resp 17   Wt 79.4 kg   SpO2 98%   Physical Exam Vitals and nursing note reviewed.  Constitutional:      General: He is not in acute distress.    Appearance: He is well-developed. He is not ill-appearing or diaphoretic.  HENT:     Head:  Atraumatic.  Eyes:     Pupils: Pupils are equal, round, and reactive to light.  Cardiovascular:     Rate and Rhythm: Normal rate and regular rhythm.     Pulses:          Radial pulses are 2+ on the right side and 2+ on the left side.  Pulmonary:     Effort: Pulmonary effort is normal. No respiratory distress.  Abdominal:     General: There is no distension.     Palpations: Abdomen is soft.  Musculoskeletal:        General: Normal range of motion.     Cervical back: Normal range of motion and neck supple.     Comments: Puncture wound left index finger lateral finger pad.  Scabbed over, soft, nontender, full range of motion.  Puncture wound right thumb, no nailbed involvement, scabbed over, no palpable foreign object to either wound  Skin:    General: Skin is warm and dry.  Neurological:     General: No focal deficit present.     Mental Status: He is alert and oriented to person, place, and time.    (all labs ordered are listed, but only abnormal results are displayed) Labs Reviewed - No data to display  EKG: None  Radiology: No results found.   Procedures   Medications Ordered in the ED  rabies immune globulin  (HYPERRAB) injection 1,500 Units (1,500  Units Intramuscular Given 08/03/24 2211)  rabies vaccine , human diploid (IMOVAX) injection 1 mL (1 mL Intramuscular Given 08/03/24 2209)  amoxicillin -clavulanate (AUGMENTIN ) 875-125 MG per tablet 1 tablet (1 tablet Oral Given 08/03/24 2210)   53 yo here for evaluation of dog bite.  Occurred yesterday between his dog and another person's dog.  He is not sure of the other rabies status of the other dog, animal control has been involved however other party did not surrender their dog or provide updated rabies documentation.  His wounds today are clean, dry.  He has puncture wounds to his left index finger and right thumb.  No bony tenderness.  Full range of motion.  No obvious infection.  He does not want imaging at this time.  Will give  Augmentin , rabies series.  He thinks his tetanus is possibly up-to-date he will check with his primary care provider on Monday if not we will get tetanus on Monday per patient.  Will have him follow-up outpatient, return for new or worsening symptoms.                                    Medical Decision Making Amount and/or Complexity of Data Reviewed External Data Reviewed: labs, radiology and notes.  Risk OTC drugs. Prescription drug management. Decision regarding hospitalization. Diagnosis or treatment significantly limited by social determinants of health.       Final diagnoses:  Dog bite, initial encounter    ED Discharge Orders          Ordered    amoxicillin -clavulanate (AUGMENTIN ) 875-125 MG tablet  Every 12 hours        08/03/24 2223               Teofil Maniaci A, PA-C 08/03/24 2223    Tonia Chew, MD 08/04/24 0020

## 2024-08-06 ENCOUNTER — Encounter (HOSPITAL_COMMUNITY): Payer: Self-pay

## 2024-08-06 ENCOUNTER — Ambulatory Visit (HOSPITAL_COMMUNITY)
Admission: EM | Admit: 2024-08-06 | Discharge: 2024-08-06 | Disposition: A | Discharge status: Home / Self Care | Attending: Family Medicine | Admitting: Family Medicine

## 2024-08-06 DIAGNOSIS — Z23 Encounter for immunization: Secondary | ICD-10-CM

## 2024-08-06 DIAGNOSIS — Z203 Contact with and (suspected) exposure to rabies: Secondary | ICD-10-CM

## 2024-08-06 MED ORDER — RABIES VIRUS VACCINE, HDC IM SUSR
1.0000 mL | Freq: Once | INTRAMUSCULAR | Status: AC
  Administered 2024-08-06: 1 mL via INTRAMUSCULAR

## 2024-08-06 MED ORDER — RABIES VIRUS VACCINE, HDC IM SUSR
INTRAMUSCULAR | Status: AC
  Filled 2024-08-06: qty 1

## 2024-08-06 NOTE — ED Triage Notes (Signed)
 Patient here today for Day 3 rabies vaccine . Patient is doing well.

## 2024-08-10 ENCOUNTER — Encounter (HOSPITAL_COMMUNITY): Payer: Self-pay

## 2024-08-10 ENCOUNTER — Ambulatory Visit (HOSPITAL_COMMUNITY)
Admission: EM | Admit: 2024-08-10 | Discharge: 2024-08-10 | Disposition: A | Discharge status: Home / Self Care | Attending: Physician Assistant | Admitting: Physician Assistant

## 2024-08-10 DIAGNOSIS — Z203 Contact with and (suspected) exposure to rabies: Secondary | ICD-10-CM

## 2024-08-10 MED ORDER — RABIES VIRUS VACCINE, HDC IM SUSR
INTRAMUSCULAR | Status: AC
  Filled 2024-08-10: qty 1

## 2024-08-10 MED ORDER — RABIES VIRUS VACCINE, HDC IM SUSR
1.0000 mL | Freq: Once | INTRAMUSCULAR | Status: AC
  Administered 2024-08-10: 1 mL via INTRAMUSCULAR

## 2024-08-10 NOTE — ED Triage Notes (Signed)
 Pt here for 3rd rabies vaccine . Denies any reactions to his previous vaccines.

## 2024-08-17 ENCOUNTER — Ambulatory Visit (HOSPITAL_COMMUNITY)
Admission: EM | Admit: 2024-08-17 | Discharge: 2024-08-17 | Disposition: A | Discharge status: Home / Self Care | Attending: Family Medicine | Admitting: Family Medicine

## 2024-08-17 DIAGNOSIS — Z23 Encounter for immunization: Secondary | ICD-10-CM

## 2024-08-17 MED ORDER — RABIES VIRUS VACCINE, HDC IM SUSR
INTRAMUSCULAR | Status: AC
  Filled 2024-08-17: qty 1

## 2024-08-17 MED ORDER — RABIES VIRUS VACCINE, HDC IM SUSR
1.0000 mL | Freq: Once | INTRAMUSCULAR | Status: AC
  Administered 2024-08-17: 1 mL via INTRAMUSCULAR

## 2024-08-17 NOTE — ED Notes (Signed)
 Rabies Vaccine  given in the right deltoid and tolerated well.

## 2024-08-17 NOTE — ED Triage Notes (Signed)
 Patient presenting for day 14 rabies vaccine . Denies any reaction to the previous injections.

## 2024-09-03 ENCOUNTER — Ambulatory Visit (HOSPITAL_COMMUNITY)
